# Patient Record
Sex: Female | Born: 1945 | Race: White | Hispanic: No | Marital: Single | Smoking: Never smoker
Health system: Southern US, Community
[De-identification: ages and names within clinical notes are randomized; demographics above are authoritative.]

## PROBLEM LIST (undated history)

## (undated) ENCOUNTER — Ambulatory Visit: Admission: EM

## (undated) DIAGNOSIS — I10 Essential (primary) hypertension: Secondary | ICD-10-CM

## (undated) DIAGNOSIS — M8580 Other specified disorders of bone density and structure, unspecified site: Secondary | ICD-10-CM

## (undated) DIAGNOSIS — H16009 Unspecified corneal ulcer, unspecified eye: Secondary | ICD-10-CM

## (undated) DIAGNOSIS — D219 Benign neoplasm of connective and other soft tissue, unspecified: Secondary | ICD-10-CM

## (undated) DIAGNOSIS — I471 Supraventricular tachycardia, unspecified: Secondary | ICD-10-CM

## (undated) DIAGNOSIS — J45909 Unspecified asthma, uncomplicated: Secondary | ICD-10-CM

## (undated) DIAGNOSIS — G4733 Obstructive sleep apnea (adult) (pediatric): Secondary | ICD-10-CM

## (undated) DIAGNOSIS — N63 Unspecified lump in unspecified breast: Secondary | ICD-10-CM

## (undated) DIAGNOSIS — M810 Age-related osteoporosis without current pathological fracture: Secondary | ICD-10-CM

## (undated) HISTORY — DX: Essential (primary) hypertension: I10

## (undated) HISTORY — DX: Benign neoplasm of connective and other soft tissue, unspecified: D21.9

## (undated) HISTORY — DX: Obstructive sleep apnea (adult) (pediatric): G47.33

## (undated) HISTORY — DX: Other specified disorders of bone density and structure, unspecified site: M85.80

## (undated) HISTORY — DX: Unspecified corneal ulcer, unspecified eye: H16.009

## (undated) HISTORY — DX: Supraventricular tachycardia: I47.1

## (undated) HISTORY — DX: Supraventricular tachycardia, unspecified: I47.10

## (undated) HISTORY — PX: VAGINAL HYSTERECTOMY: SUR661

## (undated) HISTORY — DX: Unspecified asthma, uncomplicated: J45.909

## (undated) HISTORY — DX: Unspecified lump in unspecified breast: N63.0

## (undated) HISTORY — DX: Age-related osteoporosis without current pathological fracture: M81.0

## (undated) HISTORY — PX: ANTERIOR AND POSTERIOR VAGINAL REPAIR: SUR5

---

## 2001-10-29 ENCOUNTER — Other Ambulatory Visit: Admission: RE | Admit: 2001-10-29 | Discharge: 2001-10-29 | Payer: Self-pay | Admitting: Obstetrics and Gynecology

## 2004-01-26 ENCOUNTER — Other Ambulatory Visit: Admission: RE | Admit: 2004-01-26 | Discharge: 2004-01-26 | Payer: Self-pay | Admitting: Obstetrics and Gynecology

## 2005-04-18 ENCOUNTER — Other Ambulatory Visit: Admission: RE | Admit: 2005-04-18 | Discharge: 2005-04-18 | Payer: Self-pay | Admitting: Obstetrics and Gynecology

## 2009-09-17 ENCOUNTER — Encounter (INDEPENDENT_AMBULATORY_CARE_PROVIDER_SITE_OTHER): Payer: Self-pay | Admitting: Obstetrics and Gynecology

## 2009-09-17 ENCOUNTER — Ambulatory Visit (HOSPITAL_COMMUNITY): Admission: RE | Admit: 2009-09-17 | Discharge: 2009-09-18 | Payer: Self-pay | Admitting: Obstetrics and Gynecology

## 2010-06-28 LAB — CBC
HCT: 34 % — ABNORMAL LOW (ref 36.0–46.0)
HCT: 39.3 % (ref 36.0–46.0)
Hemoglobin: 11.7 g/dL — ABNORMAL LOW (ref 12.0–15.0)
Hemoglobin: 13.7 g/dL (ref 12.0–15.0)
MCHC: 34.3 g/dL (ref 30.0–36.0)
MCHC: 34.8 g/dL (ref 30.0–36.0)
MCV: 92 fL (ref 78.0–100.0)
MCV: 93.3 fL (ref 78.0–100.0)
Platelets: 149 10*3/uL — ABNORMAL LOW (ref 150–400)
Platelets: 174 10*3/uL (ref 150–400)
RBC: 3.65 MIL/uL — ABNORMAL LOW (ref 3.87–5.11)
RBC: 4.27 MIL/uL (ref 3.87–5.11)
RDW: 12.8 % (ref 11.5–15.5)
RDW: 12.9 % (ref 11.5–15.5)
WBC: 6.8 10*3/uL (ref 4.0–10.5)
WBC: 9.3 10*3/uL (ref 4.0–10.5)

## 2011-11-15 DIAGNOSIS — N63 Unspecified lump in unspecified breast: Secondary | ICD-10-CM | POA: Insufficient documentation

## 2011-11-15 DIAGNOSIS — M810 Age-related osteoporosis without current pathological fracture: Secondary | ICD-10-CM | POA: Insufficient documentation

## 2011-11-17 ENCOUNTER — Ambulatory Visit: Payer: Self-pay | Admitting: Obstetrics and Gynecology

## 2012-01-19 ENCOUNTER — Ambulatory Visit: Payer: Self-pay | Admitting: Obstetrics and Gynecology

## 2012-01-24 ENCOUNTER — Ambulatory Visit: Payer: Self-pay | Admitting: Obstetrics and Gynecology

## 2012-02-29 ENCOUNTER — Encounter: Payer: Self-pay | Admitting: Obstetrics and Gynecology

## 2012-02-29 ENCOUNTER — Ambulatory Visit (INDEPENDENT_AMBULATORY_CARE_PROVIDER_SITE_OTHER): Payer: Medicare Other | Admitting: Obstetrics and Gynecology

## 2012-02-29 VITALS — BP 130/62 | Ht 63.0 in | Wt 189.0 lb

## 2012-02-29 DIAGNOSIS — Z124 Encounter for screening for malignant neoplasm of cervix: Secondary | ICD-10-CM

## 2012-02-29 DIAGNOSIS — M8580 Other specified disorders of bone density and structure, unspecified site: Secondary | ICD-10-CM | POA: Insufficient documentation

## 2012-02-29 DIAGNOSIS — Z01419 Encounter for gynecological examination (general) (routine) without abnormal findings: Secondary | ICD-10-CM

## 2012-02-29 NOTE — Progress Notes (Signed)
The patient is not taking hormone replacement therapy The patient  is not taking a Calcium supplement. Post-menopausal bleeding:no  Last Pap: approximate date 10/16/2008 and was normal Last mammogram: approximate date 10/2008 and was normal Last DEXA scan : T= -2.16 October 2010 Last colonoscopy:normal 2010  Urinary symptoms: none Normal bowel movements: No: pt states she feels like they are urgent.  Reports abuse at home: No   Subjective:    Holly Harrington is a 66 y.o. female G2P1 who presents for annual exam.  The patient has no complaints today.   The following portions of the patient's history were reviewed and updated as appropriate: allergies, current medications, past family history, past medical history, past social history, past surgical history and problem list.  Review of Systems Pertinent items are noted in HPI. Gastrointestinal:No change in bowel habits, no abdominal pain, no rectal bleeding Genitourinary:negative for dysuria, frequency, hematuria, nocturia and urinary incontinence    Objective:     BP 130/62  Ht 5\' 3"  (1.6 m)  Wt 189 lb (85.73 kg)  BMI 33.48 kg/m2  Weight:  Wt Readings from Last 1 Encounters:  02/29/12 189 lb (85.73 kg)     BMI: Body mass index is 33.48 kg/(m^2). General Appearance: Alert, appropriate appearance for age. No acute distress HEENT: Grossly normal Neck / Thyroid: Supple, no masses, nodes or enlargement Lungs: clear to auscultation bilaterally with minimal wheezing on Right: pt did not use her inhaler today Back: No CVA tenderness Breast Exam: No masses or nodes.No dimpling, nipple retraction or discharge. Cardiovascular: Regular rate and rhythm. S1, S2, no murmur Gastrointestinal: Soft, non-tender, no masses or organomegaly Pelvic Exam: Vulva and vagina appear normal. Bimanual exam reveals normal adnexa.Uterus surgically absent Rectovaginal: normal rectal, no masses Lymphatic Exam: Non-palpable nodes in neck, clavicular,  axillary, or inguinal regions Skin: no rash or abnormalities Neurologic: Normal gait and speech, no tremor  Psychiatric: Alert and oriented, appropriate affect.      Assessment:    Normal gyn exam  Urinary and fecal urgency with normal pelvic exam   Plan:   Recommend Cranberry supplement and Benefiber  mammogram pap smear return annually or prn DEXA next year    Silverio Lay MD

## 2014-02-10 ENCOUNTER — Encounter: Payer: Self-pay | Admitting: Obstetrics and Gynecology

## 2019-09-26 ENCOUNTER — Encounter: Payer: Self-pay | Admitting: Emergency Medicine

## 2019-09-26 ENCOUNTER — Other Ambulatory Visit: Payer: Self-pay

## 2019-09-26 ENCOUNTER — Emergency Department
Admission: EM | Admit: 2019-09-26 | Discharge: 2019-09-26 | Disposition: A | Discharge status: Home / Self Care | Payer: Medicare PPO | Attending: Student | Admitting: Student

## 2019-09-26 ENCOUNTER — Emergency Department: Payer: Medicare PPO

## 2019-09-26 DIAGNOSIS — R079 Chest pain, unspecified: Secondary | ICD-10-CM

## 2019-09-26 DIAGNOSIS — R0789 Other chest pain: Secondary | ICD-10-CM | POA: Diagnosis present

## 2019-09-26 DIAGNOSIS — Z79899 Other long term (current) drug therapy: Secondary | ICD-10-CM | POA: Insufficient documentation

## 2019-09-26 LAB — COMPREHENSIVE METABOLIC PANEL
ALT: 13 U/L (ref 0–44)
AST: 20 U/L (ref 15–41)
Albumin: 4 g/dL (ref 3.5–5.0)
Alkaline Phosphatase: 61 U/L (ref 38–126)
Anion gap: 7 (ref 5–15)
BUN: 20 mg/dL (ref 8–23)
CO2: 28 mmol/L (ref 22–32)
Calcium: 9.2 mg/dL (ref 8.9–10.3)
Chloride: 107 mmol/L (ref 98–111)
Creatinine, Ser: 0.75 mg/dL (ref 0.44–1.00)
GFR calc Af Amer: >60 mL/min (ref >60)
GFR calc non Af Amer: >60 mL/min (ref >60)
Glucose, Bld: 108 mg/dL — ABNORMAL HIGH (ref 70–99)
Potassium: 4.1 mmol/L (ref 3.5–5.1)
Sodium: 142 mmol/L (ref 135–145)
Total Bilirubin: 0.9 mg/dL (ref 0.3–1.2)
Total Protein: 7.4 g/dL (ref 6.5–8.1)

## 2019-09-26 LAB — CBC WITH DIFFERENTIAL/PLATELET
Abs Immature Granulocytes: 0.02 10*3/uL (ref 0.00–0.07)
Basophils Absolute: 0.1 10*3/uL (ref 0.0–0.1)
Basophils Relative: 1 %
Eosinophils Absolute: 0.6 10*3/uL — ABNORMAL HIGH (ref 0.0–0.5)
Eosinophils Relative: 8 %
HCT: 40 % (ref 36.0–46.0)
Hemoglobin: 13.3 g/dL (ref 12.0–15.0)
Immature Granulocytes: 0 %
Lymphocytes Relative: 42 %
Lymphs Abs: 3.2 10*3/uL (ref 0.7–4.0)
MCH: 31 pg (ref 26.0–34.0)
MCHC: 33.3 g/dL (ref 30.0–36.0)
MCV: 93.2 fL (ref 80.0–100.0)
Monocytes Absolute: 0.4 10*3/uL (ref 0.1–1.0)
Monocytes Relative: 5 %
Neutro Abs: 3.3 10*3/uL (ref 1.7–7.7)
Neutrophils Relative %: 44 %
Platelets: 184 10*3/uL (ref 150–400)
RBC: 4.29 MIL/uL (ref 3.87–5.11)
RDW: 12.8 % (ref 11.5–15.5)
WBC: 7.7 10*3/uL (ref 4.0–10.5)
nRBC: 0 % (ref 0.0–0.2)

## 2019-09-26 LAB — TROPONIN I (HIGH SENSITIVITY)
Troponin I (High Sensitivity): 9 ng/L (ref <18)
Troponin I (High Sensitivity): 10 ng/L (ref <18)

## 2019-09-26 IMAGING — CR DG CHEST 2V
1 series · 2 of 2 positions shown · non-contrast
Comparison: None.

CLINICAL DATA: Chest pain

EXAM:
CHEST - 2 VIEW

[Series 1: dg chest 2 view · 0.14mm/px · 2 of 2 slices shown]
[im 1/2]
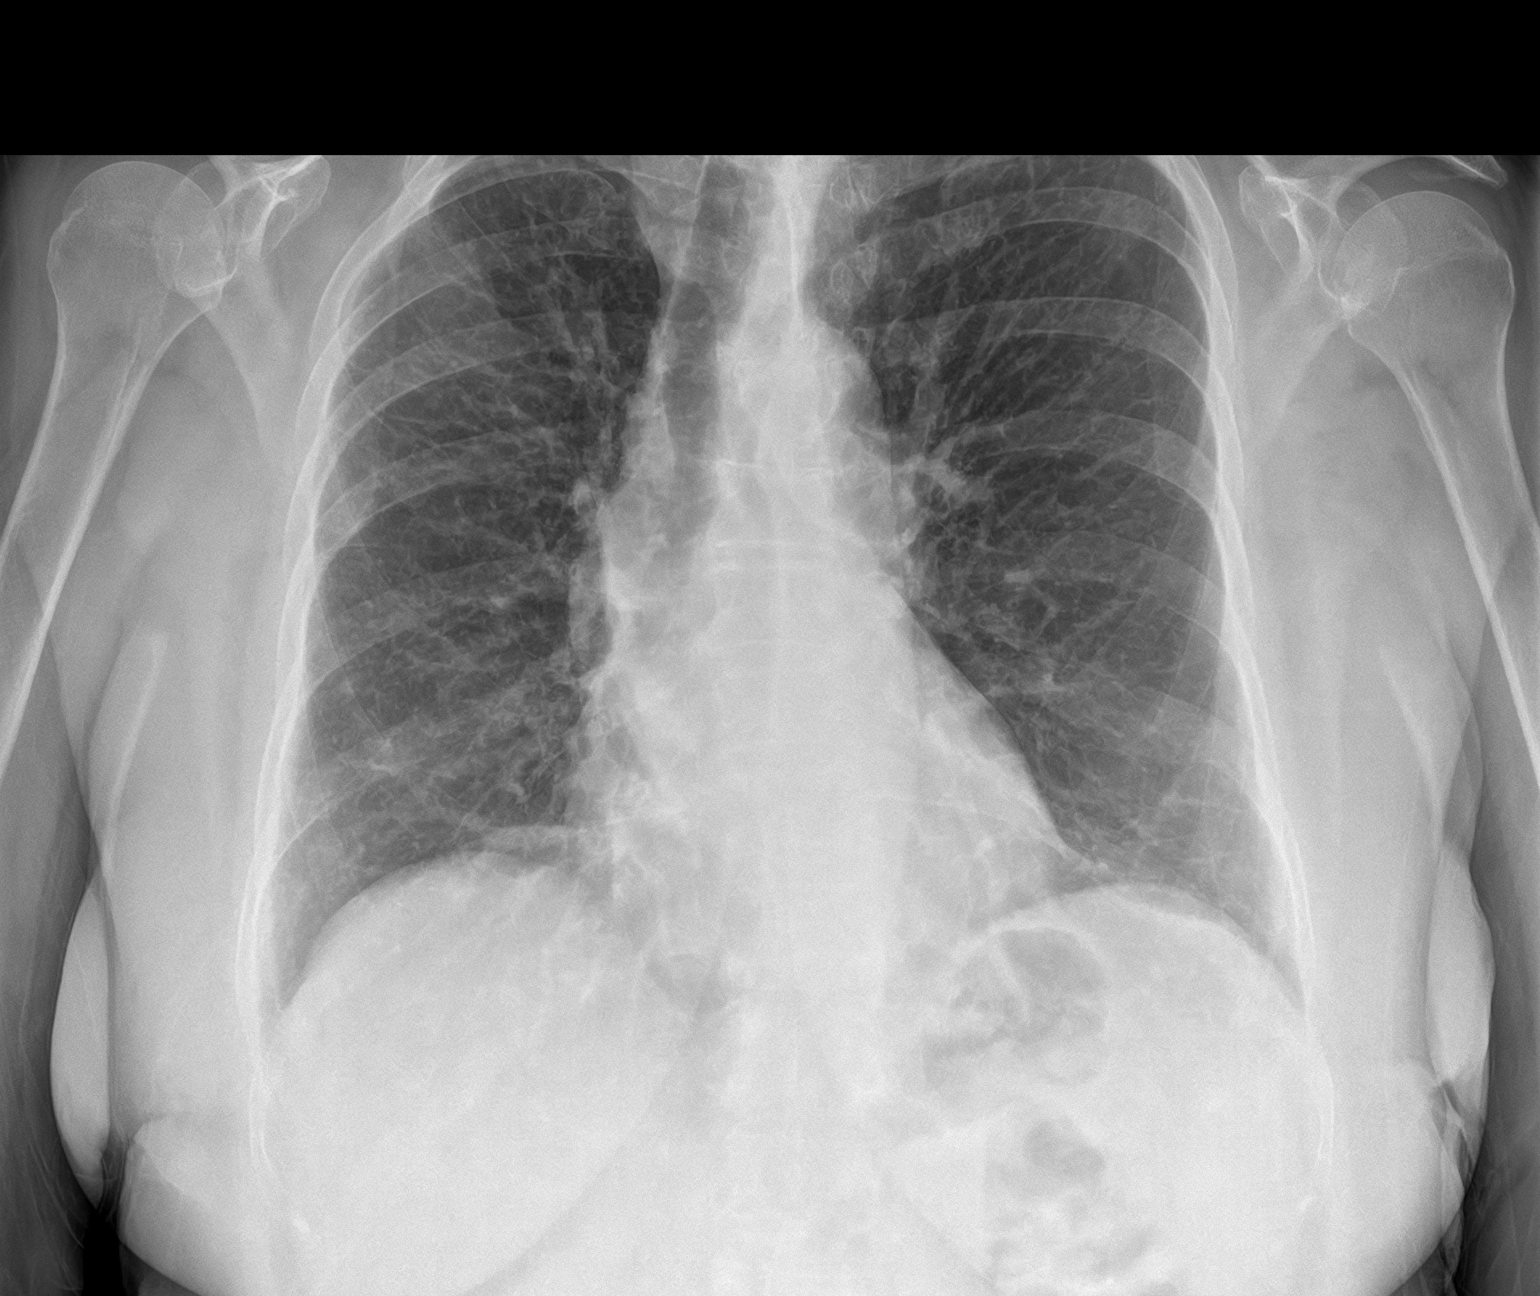
[im 2/2]
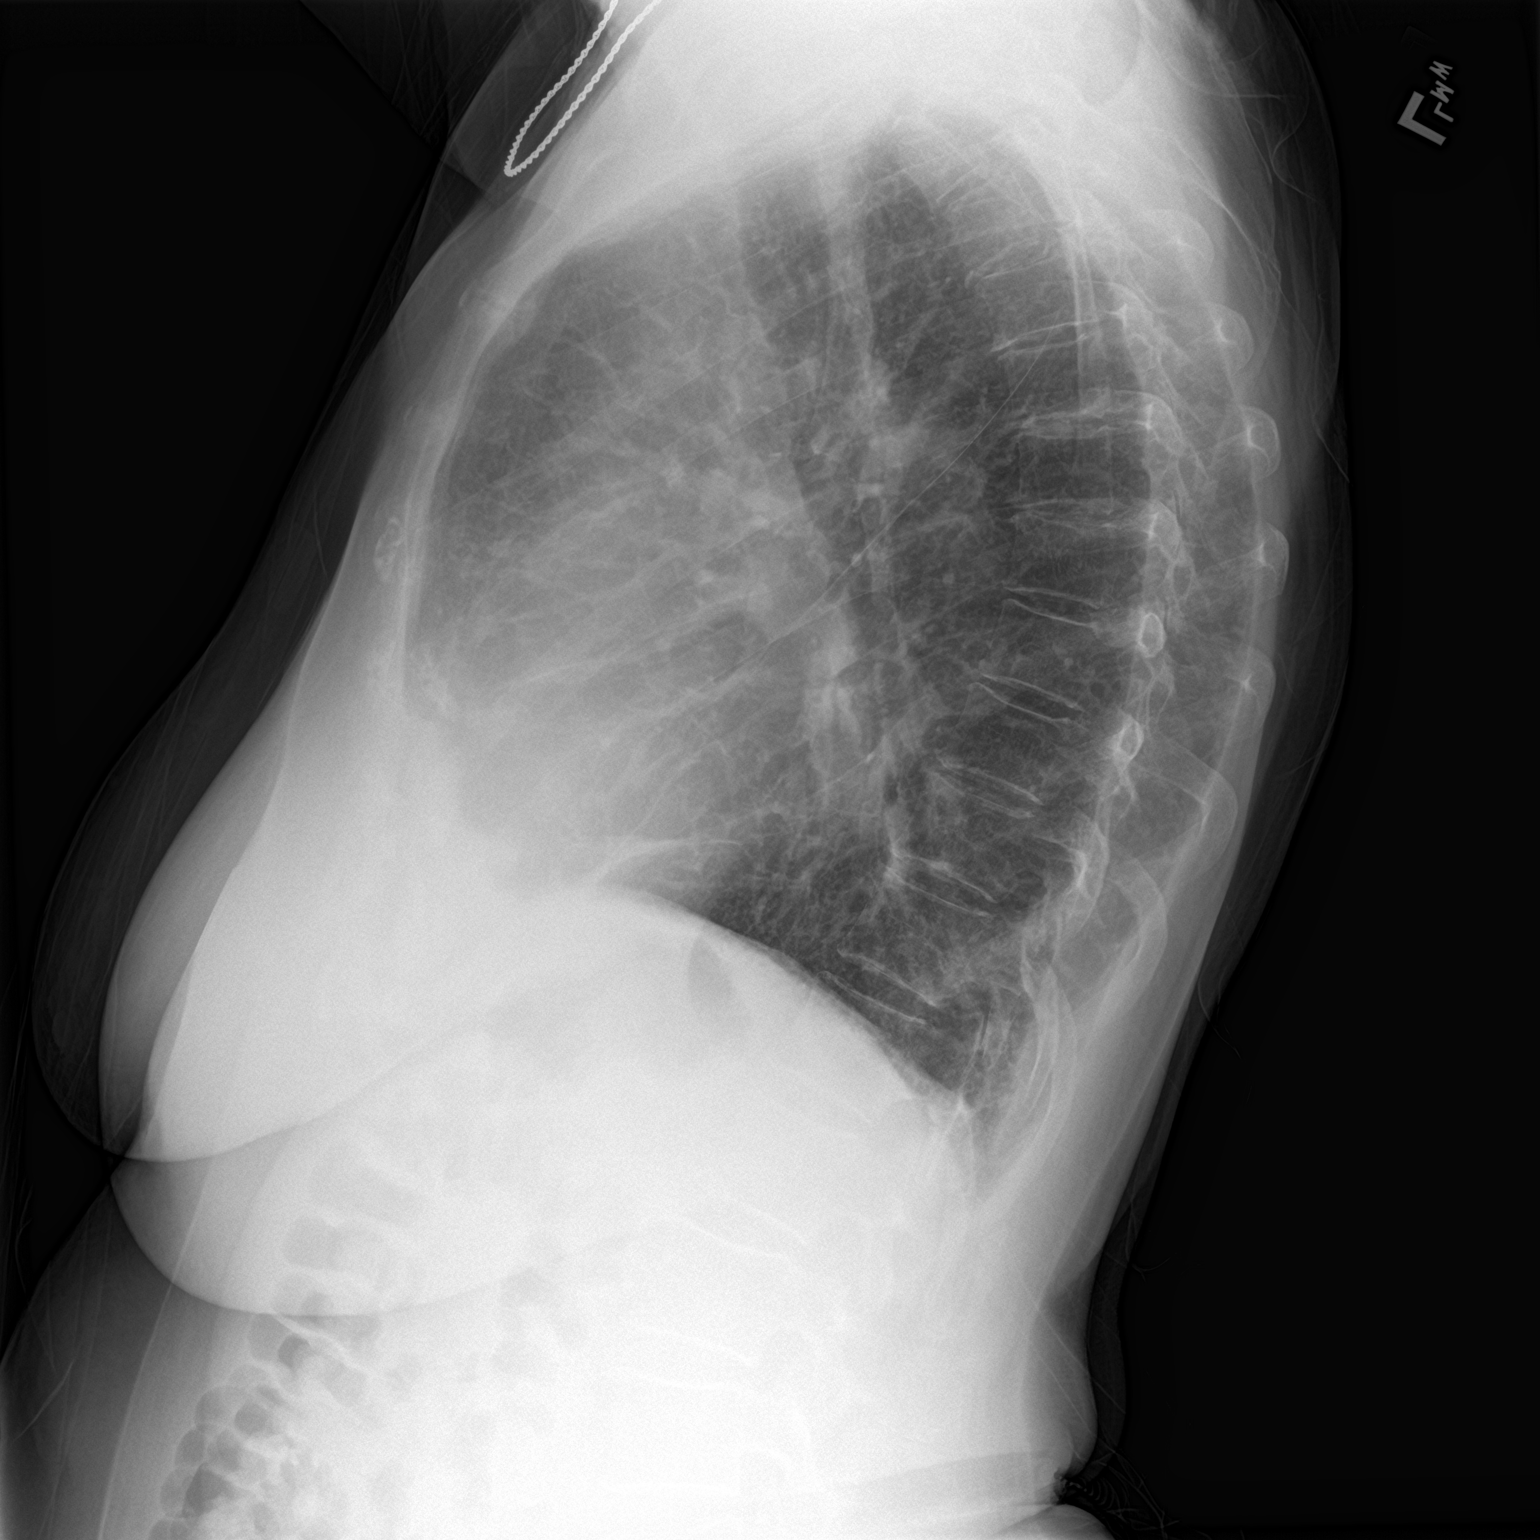

[2 of 2 positions shown; findings below may reference images not displayed]

FINDINGS: The heart size and mediastinal contours are within normal limits.
Mildly increased interstitial markings are seen at both lung bases.
No large airspace consolidation or pleural effusion. No acute
osseous abnormality.
IMPRESSION: Mildly increased interstitial markings of both lung bases which may
be due to atelectasis and/or chronic lung changes.

## 2019-09-26 MED ORDER — NAPROXEN 500 MG PO TABS: 500.0000 mg | ORAL_TABLET | Freq: Two times a day (BID) | ORAL | 0 refills | Status: AC

## 2019-09-26 MED ORDER — ASPIRIN 81 MG PO CHEW
324.0000 mg | CHEWABLE_TABLET | Freq: Once | ORAL | Status: AC
  Administered 2019-09-26: 324 mg via ORAL
  Filled 2019-09-26: qty 4

## 2019-09-26 NOTE — ED Triage Notes (Signed)
Pt to triage via w/c with no distress noted, mask in place; pt reports awoke to go to BR and began having pain beneath left breast, nonradiating with no accomp symptoms

## 2019-09-26 NOTE — Discharge Instructions (Addendum)
Thank you for letting us take care of you in the emergency department today.   Please continue to take any regular, prescribed medications.   New medications we have prescribed:  Naproxen, for pain/inflammation  Please follow up with: Cardiology doctor, information below  Please return to the ER for any new or worsening symptoms.

## 2019-09-26 NOTE — ED Provider Notes (Signed)
[facility] Emergency Department Provider Note  ____________________________________________   First MD Initiated Contact with Patient 09/26/19 [phone]     (approximate)  I have reviewed the triage vital signs and the nursing notes.  History  Chief Complaint Chest Pain    HPI Holly Harrington is a 74 y.o. female w/ hx of asthma, osteoporosis, who presents for an episode of chest pain.  Patient states she woke up in the middle the night to use the restroom.  While walking to the restroom she developed sudden onset of left-sided chest pain.  She locates it just underneath her breast.  Hervey Ard and severe.  No radiation.  She tried laying back in the bed, but states the pain was too severe.  Rested for a little while on the couch but felt like her symptoms were worsening, therefore presented to the ER for further evaluation.  Since being in the waiting room her pain has significantly improved and by my evaluation she states she is asymptomatic.  Pain currently 0/10.  No associated nausea, diaphoresis, shortness of breath.  No fevers or cough.  No changes to her activity level.  No rashes or lesions.  No trauma. No recent illnesses/infection.    Past Medical Hx Past Medical History:  Diagnosis Date  . Asthma   . Breast lump   . Fibroid   . Osteopenia   . Osteoporosis     Problem List Patient Active Problem List   Diagnosis Date Noted  . Osteoporosis   . Breast lump     Past Surgical Hx Past Surgical History:  Procedure Laterality Date  . ANTERIOR AND POSTERIOR VAGINAL REPAIR    . VAGINAL HYSTERECTOMY      Medications Prior to Admission medications   Medication Sig Start Date End Date Taking? Authorizing Provider  ALBUTEROL IN Inhale into the lungs.    [provider]  fish oil-omega-3 fatty acids 1000 MG capsule Take 2 g by mouth daily.    [provider]  fluticasone (FLOVENT HFA) 110 MCG/ACT inhaler Inhale 1 puff into the lungs 2  (two) times daily.    [provider]  Multiple Vitamin (MULTIVITAMIN) tablet Take 1 tablet by mouth daily.    [provider]    Allergies Codeine, Hydrocodone, and Oxycodone  Family Hx Family History  Problem Relation Age of Onset  . Breast cancer Mother 51  . Breast cancer Maternal Aunt 83    Social Hx Social History   Tobacco Use  . Smoking status: Never Smoker  . Smokeless tobacco: Never Used  Substance Use Topics  . Alcohol use: No  . Drug use: No     Review of Systems  Constitutional: Negative for fever. Negative for chills. Eyes: Negative for visual changes. ENT: Negative for sore throat. Cardiovascular: + for chest pain. Respiratory: Negative for shortness of breath. Gastrointestinal: Negative for nausea. Negative for vomiting.  Genitourinary: Negative for dysuria. Musculoskeletal: Negative for leg swelling. Skin: Negative for rash. Neurological: Negative for headaches.   Physical Exam  Vital Signs: ED Triage Vitals  Enc Vitals Group     BP 09/26/19 0426 (!) 190/74     Pulse Rate 09/26/19 0426 73     Resp 09/26/19 0426 17     Temp 09/26/19 0426 97.8 F (36.6 C)     Temp Source 09/26/19 0426 Oral     SpO2 09/26/19 0426 97 %     Weight 09/26/19 0426 175 lb (79.4 kg)  Height 09/26/19 0426 5\' 3"  (1.6 m)     Head Circumference --      Peak Flow --      Pain Score 09/26/19 0425 5     Pain Loc --      Pain Edu? --      Excl. in [place]? --     Constitutional: Alert and oriented. Well appearing. NAD.  Head: Normocephalic. Atraumatic. Eyes: Conjunctivae clear. Sclera anicteric. Pupils equal and symmetric. Nose: No masses or lesions. No congestion or rhinorrhea. Mouth/Throat: Wearing mask.  Neck: No stridor. Trachea midline.  Cardiovascular: Normal rate, regular rhythm. Extremities well perfused. Respiratory: Normal respiratory effort.  Lungs CTAB.  Rare, faint inspiratory squeak on left.  Chest wall NT, no palpable step-offs,  deformities, or crepitance. Gastrointestinal: Soft. Non-distended. Non-tender.  Genitourinary: Deferred. Musculoskeletal: No lower extremity edema. No deformities. Neurologic:  Normal speech and language. No gross focal or lateralizing neurologic deficits are appreciated.  Skin: Skin is warm, dry and intact. No rash noted.  On visual inspection of the chest wall, there are no ecchymosis, lacerations, vesicles, or other lesions. Psychiatric: Mood and affect are appropriate for situation.  EKG  Personally reviewed and interpreted by myself.   Date: 09/26/19 Time: 0428 Rate: 71 Rhythm: sinus Axis: borderline left Intervals: PR 200 ms No acute ischemic changes No acute arrhythmias  No STEMI    Radiology  Personally reviewed available imaging myself.   CXR - IMPRESSION:  Mildly increased interstitial markings of both lung bases which may  be due to atelectasis and/or chronic lung changes.    Procedures  Procedure(s) performed (including critical care):  Procedures   Initial Impression / Assessment and Plan / MDM / ED Course  74 y.o. female who presents to the ED for an episode of sharp, left-sided chest pain, now resolved.  Ddx: ACS, costochondritis/MSK, pleurisy.  No shortness of breath, tachycardia, tachypnea, or hypoxia, therefore doubt PE.  No evidence on exam for shingles.  Will plan for labs, imaging, EKG, ASA  Troponin x 2 negative.  EKG as above, no acute arrhythmias, no STEMI, no acute ischemic changes.  Remainder of blood work without actual derangements.  CXR with atelectasis and/or chronic lung changes.  No consolidation, no fever or cough to suggest infection.  Given negative work-up, feel patient stable for discharge.  Suspect a component of pleurisy based on her history.  Will plan for Rx for naproxen for symptom control.  Advised outpatient follow-up, referral to cardiology, and given return precautions.  Patient voices understanding is in  agreement.   _______________________________   As part of my medical decision making I have reviewed available labs, radiology tests, reviewed old records/performed chart review.    Final Clinical Impression(s) / ED Diagnosis  Final diagnoses:  Chest pain in adult       Note:  This document was prepared using Dragon voice recognition software and may include unintentional dictation errors.   Lilia Pro., MD 09/26/19 [phone]

## 2019-10-22 NOTE — Progress Notes (Signed)
New Outpatient Visit Date: 10/23/2019  Referring Provider: Lilia Pro., MD [place],  [place] [address]  Chief Complaint: Chest pain  HPI:  Holly Harrington is a 74 y.o. female who is being seen today for the evaluation of chest pain at the request of Dr. Joan Mayans. She has a history of asthma and osteoporosis.  She presented to the [facility] emergency department last month (09/26/2019) having developed sudden onset of left-sided chest pain while walking to the bathroom in the middle the night.  ED work-up was unrevealing with negative troponin and normal EKG other than borderline LVH.  Today, Holly Harrington reports that she is feeling well.  On the day of her ED admission, she was returning from the bathroom to her bed when she had sudden onset of severe substernal and left-sided chest pain.  It was sharp and prevented her from climbing back to bed.  She therefore walked to her living room and sat down but needed to stop along the way due to the pain.  Her son found her and ultimately brought her to the [facility] emergency department due to persistent pain.  Pain began to subside as she proceeded to the emergency department and had essentially resolved by the time she was seen.  Work-up was unrevealing and she was given a diagnosis of pleurisy.  Holly Harrington notes that she has experienced intermittent chest pain with deep inspiration over the last several years, though she is unsure if deep inspiration seem to affect her pain this time.  Holly Harrington denies a history of prior cardiac disease.  Holly Harrington notes occasional palpitations that are most pronounced when she lays on her left side.  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  She was on methotrexate at one point, though this was discontinued, possibly due to a lung abnormality noted on CT of the chest at [facility] a few years  ago.  --------------------------------------------------------------------------------------------------  Cardiovascular History & Procedures: Cardiovascular Problems:  Chest pain  Palpitations  Risk Factors:  Age greater than 67 and obesity  Cath/PCI:  None  CV Surgery:  None  EP Procedures and Devices:  None  Non-Invasive Evaluation(s):  None  Recent CV Pertinent Labs: Lab Results  Component Value Date   K 4.1 09/26/2019   BUN 20 09/26/2019   CREATININE 0.75 09/26/2019    --------------------------------------------------------------------------------------------------  Past Medical History:  Diagnosis Date  . Asthma   . Breast lump   . Fibroid   . Osteopenia   . Osteoporosis     Past Surgical History:  Procedure Laterality Date  . ANTERIOR AND POSTERIOR VAGINAL REPAIR    . VAGINAL HYSTERECTOMY      Current Meds  Medication Sig  . ALBUTEROL IN Inhale into the lungs as needed.   . CYANOCOBALAMIN PO Take by mouth daily.  . Multiple Vitamin (MULTIVITAMIN) tablet Take 1 tablet by mouth daily.  Marland Kitchen VITAMIN D, CHOLECALCIFEROL, PO Take by mouth daily.    Allergies: Codeine, Hydrocodone, and Oxycodone  Social History   Tobacco Use  . Smoking status: Never Smoker  . Smokeless tobacco: Never Used  Vaping Use  . Vaping Use: Never used  Substance Use Topics  . Alcohol use: No  . Drug use: No    Family History  Problem Relation Age of Onset  . Breast cancer Mother 63  . Breast cancer Maternal Aunt 36    Review of Systems: A 12-system  review of systems was performed and was negative except as noted in the HPI.  --------------------------------------------------------------------------------------------------  Physical Exam: BP 140/80 (BP Location: Right Arm, Patient Position: Sitting, Cuff Size: Normal)   Pulse 70   Ht 5\' 3"  (1.6 m)   Wt 172 lb 8 oz (78.2 kg)   SpO2 94%   BMI 30.56 kg/m   General: NAD. HEENT: No conjunctival pallor  or scleral icterus. Facemask in place. Neck: Supple without lymphadenopathy, thyromegaly, JVD, or HJR. No carotid bruit. Lungs: Normal work of breathing. Clear to auscultation bilaterally without wheezes or crackles. Heart: Regular rate and rhythm without murmurs, rubs, or gallops. Non-displaced PMI. Abd: Bowel sounds present. Soft, NT/ND without hepatosplenomegaly Ext: No lower extremity edema. Radial, PT, and DP pulses are 2+ bilaterally Skin: Warm and dry without rash. Neuro: CNIII-XII intact. Strength and fine-touch sensation intact in upper and lower extremities bilaterally. Psych: Normal mood and affect.  EKG: Normal sinus rhythm with borderline LVH.  No significant change from prior tracing on 09/26/2019.  Lab Results  Component Value Date   WBC 7.7 09/26/2019   HGB 13.3 09/26/2019   HCT 40.0 09/26/2019   MCV 93.2 09/26/2019   PLT 184 09/26/2019    Lab Results  Component Value Date   NA 142 09/26/2019   K 4.1 09/26/2019   CL 107 09/26/2019   CO2 28 09/26/2019   BUN 20 09/26/2019   CREATININE 0.75 09/26/2019   GLUCOSE 108 (H) 09/26/2019   ALT 13 09/26/2019    No results found for: CHOL, HDL, LDLCALC, LDLDIRECT, TRIG, CHOLHDL   --------------------------------------------------------------------------------------------------  ASSESSMENT AND PLAN: Chest pain: Timing and quality of pain are atypical for coronary insufficiency.  Work-up in the ED was unrevealing with nonischemic EKG and negative high-sensitivity troponin x2.  Examination today is unrevealing.  Cardiac risk factors include age and obesity.  I have recommended that we obtain an exercise tolerance test to exclude underlying obstructive CAD.  In the meantime, I have asked Holly Harrington to begin taking aspirin 81 mg daily.  Palpitations: Intermittent and most pronounced when lying in the left lateral decubitus position.  EKG today is without arrhythmia.  We have discussed further evaluation and have agreed to  obtain a 14-day event monitor, to be placed immediately after aforementioned exercise tolerance test.  Elevated blood pressure: Blood pressure borderline elevated today.  We will defer medication changes at this time, though if it remains at or above 140/90, pharmacotherapy will need to be considered down the road.  Follow-up: Return to clinic in 2 months (after completion of exercise tolerance test and event monitor).  [name], MD 10/23/2019 10:55 AM

## 2019-10-23 ENCOUNTER — Ambulatory Visit: Payer: Medicare PPO | Admitting: Internal Medicine

## 2019-10-23 ENCOUNTER — Other Ambulatory Visit: Payer: Self-pay

## 2019-10-23 ENCOUNTER — Encounter: Payer: Self-pay | Admitting: Internal Medicine

## 2019-10-23 VITALS — BP 140/80 | HR 70 | Ht 63.0 in | Wt 172.5 lb

## 2019-10-23 DIAGNOSIS — R002 Palpitations: Secondary | ICD-10-CM | POA: Diagnosis not present

## 2019-10-23 DIAGNOSIS — R030 Elevated blood-pressure reading, without diagnosis of hypertension: Secondary | ICD-10-CM

## 2019-10-23 DIAGNOSIS — R079 Chest pain, unspecified: Secondary | ICD-10-CM | POA: Diagnosis not present

## 2019-10-23 MED ORDER — ASPIRIN EC 81 MG PO TBEC: 81.0000 mg | DELAYED_RELEASE_TABLET | Freq: Every day | ORAL | 3 refills | Status: DC

## 2019-10-23 NOTE — Patient Instructions (Addendum)
Medication Instructions:  Your physician has recommended you make the following change in your medication:  1- START Aspirin 81 mg by mouth once a day.  *If you need a refill on your cardiac medications before your next appointment, please call your pharmacy*  Lab Work: none If you have labs (blood work) drawn today and your tests are completely normal, you will receive your results only by: Marland Kitchen MyChart Message (if you have MyChart) OR . A paper copy in the mail If you have any lab test that is abnormal or we need to change your treatment, we will call you to review the results.   Testing/Procedures:  1- Exercise Stress Test - Your physician has requested that you have an exercise tolerance test.    DO NOT drink or eat foods with caffeine for 24 hours before the test. (Chocolate, coffee, tea, decaf coffee/tea, or energy drinks)  DO NOT smoke for 4 hours before your test.  If you use an inhaler, bring it with you to the test.  Wear comfortable shoes and clothing. Women do not wear dresses.   2- ZIO MONITOR for 14 DAYS - To be placed on the day of the treadmill test.  Your physician has recommended that you wear a Zio monitor. This monitor is a medical device that records the heart's electrical activity. Doctors most often use these monitors to diagnose arrhythmias. Arrhythmias are problems with the speed or rhythm of the heartbeat. The monitor is a small device applied to your chest. You can wear one while you do your normal daily activities. While wearing this monitor if you have any symptoms to push the button and record what you felt. Once you have worn this monitor for the period of time provider prescribed (Usually 14 days), you will return the monitor device in the postage paid box. Once it is returned they will download the data collected and provide [place] with a report which the provider will then review and we will call you with those results. Important tips:  1. Avoid showering  during the first 24 hours of wearing the monitor. 2. Avoid excessive sweating to help maximize wear time. 3. Do not submerge the device, no hot tubs, and no swimming pools. 4. Keep any lotions or oils away from the patch. 5. After 24 hours you may shower with the patch on. Take brief showers with your back facing the shower head.  6. Do not remove patch once it has been placed because that will interrupt data and decrease adhesive wear time. 7. Push the button when you have any symptoms and write down what you were feeling. 8. Once you have completed wearing your monitor, remove and place into box which has postage paid and place in your outgoing mailbox.  9. If for some reason you have misplaced your box then call our office and we can provide another box and/or mail it off for you.   Follow-Up: At [facility], you and your health needs are our priority.  As part of our continuing mission to provide you with exceptional heart care, we have created designated Provider Care Teams.  These Care Teams include your primary Cardiologist (physician) and Advanced Practice Providers (APPs -  Physician Assistants and Nurse Practitioners) who all work together to provide you with the care you need, when you need it.  We recommend signing up for the patient portal called "MyChart".  Sign up information is provided on this After Visit Summary.  MyChart is used to  connect with patients for Virtual Visits (Telemedicine).  Patients are able to view lab/test results, encounter notes, upcoming appointments, etc.  Non-urgent messages can be sent to your provider as well.   To learn more about what you can do with MyChart, go to [email or web address].    Your next appointment:   2 month(s)  The format for your next appointment:   In Person  Provider:    You may see DR Harrell Gave END or one of the following Advanced Practice Providers on your designated Care Team:    Murray Hodgkins, NP  Christell Faith,  PA-C  Marrianne Mood, PA-C    Exercise Stress Test An exercise stress test is a test to check how your heart works during exercise. You will need to walk on a treadmill or ride an exercise bike for this test. An electrocardiogram (ECG) will record your heartbeat when you are at rest and when you are exercising. You may have an ultrasound or nuclear test after the exercise test. The test is done to check for coronary artery disease (CAD). It is also done to:  See how well you can exercise.  Watch for high blood pressure during exercise.  Test how well you can exercise after treatment.  Check the blood flow to your arms and legs. If your test result is not normal, more testing may be needed. What happens before the procedure?  Follow instructions from your doctor about what you cannot eat or drink. ? Do not have any drinks or foods that have caffeine in them for 24 hours before the test, or as told by your doctor. This includes coffee, tea (even decaf tea), sodas, chocolate, and cocoa.  Ask your doctor about changing or stopping your normal medicines. This is important if you: ? Take diabetes medicines. ? Take beta-blocker medicines. ? Wear a nitroglycerin patch.  If you use an inhaler, bring it with you to the test.  Do not put lotions, powders, creams, or oils on your chest before the test.  Wear comfortable shoes and clothing.  Do not use any products that have nicotine or tobacco in them, such as cigarettes and e-cigarettes. Stop using them at least 4 hours before the test. If you need help quitting, ask your doctor. What happens during the procedure?   Patches (electrodes) will be put on your chest.  Wires will be connected to the patches. The wires will send signals to a machine to record your heartbeat.  Your heart rate will be watched while you are resting and while you are exercising. Your blood pressure will also be watched during the test.  You will walk on a  treadmill or use a stationary bike. If you cannot use these, you may be asked to turn a crank with your hands.  The activity will get harder and will raise your heart rate.  You may be asked to breathe into a tube a few times during the test. This measures the gases that you breathe out.  You will be asked how you are feeling throughout the test.  You will exercise until your heart reaches a target heart rate. You will stop early if: ? You feel dizzy. ? You have chest pain. ? You are out of breath. ? Your blood pressure is too high or too low. ? You have an irregular heartbeat. ? You have pain or aching in your arms or legs. The procedure may vary among doctors and hospitals. What happens after the procedure?  Your blood  pressure, heart rate, breathing rate, and blood oxygen level will be watched after the test.  You may return to your normal diet and activities as told by your doctor.  It is up to you to get the results of your test. Ask your doctor, or the department that is doing the test, when your results will be ready. Summary  An exercise stress test is a test to check how your heart works during exercise.  This test is done to check for coronary artery disease.  Your heart rate will be watched while you are resting and while you are exercising.  Follow instructions from your doctor about what you cannot eat or drink before the test. This information is not intended to replace advice given to you by your health care provider. Make sure you discuss any questions you have with your health care provider. Document Revised: 07/10/2018 Document Reviewed: 06/28/2016 Elsevier Patient Education  [place].

## 2019-11-06 ENCOUNTER — Other Ambulatory Visit: Payer: Self-pay

## 2019-11-06 ENCOUNTER — Ambulatory Visit: Payer: Medicare PPO

## 2019-11-22 ENCOUNTER — Ambulatory Visit (INDEPENDENT_AMBULATORY_CARE_PROVIDER_SITE_OTHER): Payer: Medicare PPO

## 2019-11-22 ENCOUNTER — Other Ambulatory Visit: Payer: Self-pay

## 2019-11-22 ENCOUNTER — Other Ambulatory Visit: Payer: Self-pay | Admitting: *Deleted

## 2019-11-22 ENCOUNTER — Ambulatory Visit: Payer: Medicare PPO

## 2019-11-22 DIAGNOSIS — R079 Chest pain, unspecified: Secondary | ICD-10-CM

## 2019-11-22 DIAGNOSIS — R002 Palpitations: Secondary | ICD-10-CM

## 2019-11-26 LAB — EXERCISE TOLERANCE TEST
Estimated workload: 5.1 METS
Exercise duration (min): 3 min
Exercise duration (sec): 28 s
MPHR: 144 {beats}/min
Peak HR: 125 {beats}/min
Percent HR: 86 %
RPE: 19
Rest HR: 88 {beats}/min

## 2019-12-24 ENCOUNTER — Other Ambulatory Visit: Payer: Self-pay

## 2019-12-24 ENCOUNTER — Ambulatory Visit: Payer: Medicare PPO | Admitting: Physician Assistant

## 2019-12-24 ENCOUNTER — Encounter: Payer: Self-pay | Admitting: Physician Assistant

## 2019-12-24 VITALS — BP 160/90 | HR 82 | Ht 63.75 in | Wt 170.0 lb

## 2019-12-24 DIAGNOSIS — R030 Elevated blood-pressure reading, without diagnosis of hypertension: Secondary | ICD-10-CM | POA: Diagnosis not present

## 2019-12-24 DIAGNOSIS — I493 Ventricular premature depolarization: Secondary | ICD-10-CM

## 2019-12-24 DIAGNOSIS — I491 Atrial premature depolarization: Secondary | ICD-10-CM

## 2019-12-24 DIAGNOSIS — R079 Chest pain, unspecified: Secondary | ICD-10-CM

## 2019-12-24 DIAGNOSIS — R002 Palpitations: Secondary | ICD-10-CM | POA: Diagnosis not present

## 2019-12-24 DIAGNOSIS — I471 Supraventricular tachycardia: Secondary | ICD-10-CM

## 2019-12-24 MED ORDER — AMLODIPINE BESYLATE 2.5 MG PO TABS: 2.5000 mg | ORAL_TABLET | Freq: Every day | ORAL | 3 refills | Status: DC

## 2019-12-24 MED ORDER — CARVEDILOL 3.125 MG PO TABS: 3.1250 mg | ORAL_TABLET | Freq: Two times a day (BID) | ORAL | 3 refills | Status: DC

## 2019-12-24 NOTE — Patient Instructions (Addendum)
Medication Instructions:  1- START Amlodipine Take 1 tablet (2.5 mg total) by mouth daily *If you need a refill on your cardiac medications before your next appointment, please call your pharmacy*   Lab Work: none ordered If you have labs (blood work) drawn today and your tests are completely normal, you will receive your results only by: Holly Harrington MyChart Message (if you have MyChart) OR . A paper copy in the mail If you have any lab test that is abnormal or we need to change your treatment, we will call you to review the results.   Testing/Procedures:    Follow-Up: At [facility], you and your health needs are our priority.  As part of our continuing mission to provide you with exceptional heart care, we have created designated Provider Care Teams.  These Care Teams include your primary Cardiologist (physician) and Advanced Practice Providers (APPs -  Physician Assistants and Nurse Practitioners) who all work together to provide you with the care you need, when you need it.  We recommend signing up for the patient portal called "MyChart".  Sign up information is provided on this After Visit Summary.  MyChart is used to connect with patients for Virtual Visits (Telemedicine).  Patients are able to view lab/test results, encounter notes, upcoming appointments, etc.  Non-urgent messages can be sent to your provider as well.   To learn more about what you can do with MyChart, go to [email or web address].    Your next appointment:   2-3 week(s)  The format for your next appointment:   In Person  Provider:    You may see Dr. Saunders Revel or one of the following Advanced Practice Providers on your designated Care Team:    Murray Hodgkins, NP  Christell Faith, PA-C  Marrianne Mood, PA-C    Other Instructions  Please purchase an upper arm blood pressure cuff, as these are more accurate than the wrist blood pressure cuff. Take your blood pressure at the same time every day and at least once or  twice a week.  As agreed, please start your amlodipine 2.5 mg daily for your blood pressure (one pill per day) once you have finished your bladder medication.  As discussed, think about obtaining an echocardiogram of your heart.  Your monitor showed a faster heart rhythm from the top part of your heart.   You also had extra beats from the top and bottom part of your heart. You should receive a phone call to confirm the results of your monitoring once read by Dr. Saunders Revel.   Please refer to the information provided below.   Supraventricular Tachycardia, Adult Supraventricular tachycardia (SVT) is a kind of abnormal heartbeat. It makes your heart beat very fast and then beat at a normal speed. A normal resting heartbeat is 60-100 times a minute. This condition can make your heart beat more than 150 times a minute. Times of having a fast heartbeat (episodes) can be scary, but they are usually not dangerous. In some cases, they may lead to heart failure if:  They happen many times per day.  Last longer than a few seconds. What are the causes?   A normal heartbeat starts when an area called the sinoatrial node sends out an electrical signal. In SVT, other areas of the heart send out signals that get in the way of the signal from the sinoatrial node. What increases the risk? You are more likely to develop this condition if you are:  76-49 years old.  A woman. The  following factors may make you more likely to develop this condition:  Stress.  Tiredness.  Smoking.  Stimulant drugs, such as cocaine and methamphetamine.  Alcohol.  Caffeine.  Pregnancy.  Feeling worried or nervous (anxiety). What are the signs or symptoms?  A pounding heart.  A feeling that your heart is skipping beats (palpitations).  Weakness.  Trouble getting enough air.  Pain or tightness in your chest.  Feeling like you are going to pass out (faint).  Feeling worried or  nervous.  Dizziness.  Sweating.  Feeling sick to your stomach (nausea).  Passing out.  Tiredness. Sometimes, there are no symptoms. How is this treated?  Vagal nerve stimulation. Ways to do this include: ? Holding your breath and pushing, as though you are pooping (having a bowel movement). ? Massaging an area on one side of your neck. Do not try this yourself. Only a doctor should do this. If done the wrong way, it can lead to a stroke. ? Bending forward with your head between your legs. ? Coughing while bending forward with your head between your legs. ? Closing your eyes and massaging your eyeballs. Ask a doctor how to do this.  Medicines that prevent attacks.  Medicine to stop an attack given through an IV tube at the hospital.  A small electric shock (cardioversion) that stops an attack.  Radiofrequency ablation. In this procedure, a small, thin tube (catheter) is used to send energy to the area that is causing the rapid heartbeats. If you do not have symptoms, you may not need treatment. Follow these instructions at home: Stress  Avoid things that make you feel stressed.  To deal with stress, try: ? Doing yoga or meditation, or being out in nature. ? Listening to relaxing music. ? Doing deep breathing. ? Taking steps to be healthy, such as getting lots of sleep, exercising, and eating a balanced diet. ? Talking with a mental health doctor. Lifestyle    Try to get at least 7 hours of sleep each night.  Do not use any products that contain nicotine or tobacco, such as cigarettes, e-cigarettes, and chewing tobacco. If you need help quitting, ask your doctor.  Be aware of how alcohol affects you. ? If alcohol gives you a fast heartbeat, do not drink alcohol. ? If alcohol does not seem to give you a fast heartbeat, limit alcohol use to no more than 1 drink a day for women who are not pregnant, and 2 drinks a day for men. In the [place] one drink is one of  these:  12 oz of beer (355 mL).  5 oz of wine (148 mL).  1 oz of hard liquor (44 mL).  Be aware of how caffeine affects you. ? If caffeine gives you a fast heartbeat, do not eat, drink, or use anything with caffeine in it. ? If caffeine does not seem to give you a fast heartbeat, limit how much caffeine you eat, drink, or use.  Do not use stimulant drugs. If you need help quitting, ask your doctor. General instructions  Stay at a healthy weight.  Exercise regularly. Ask your doctor about good activities for you. Try one or a mixture of these: ? 150 minutes a week of gentle exercise, like walking or yoga. ? 75 minutes a week of exercise that is very active, like running or swimming.  Do vagus nerve treatments to slow down your heartbeat as told by your doctor.  Take over-the-counter and prescription medicines only as told  by your doctor.  Keep all follow-up visits as told by your doctor. This is important. Contact a doctor if:  You have a fast heartbeat more often.  Times of having a fast heartbeat last longer than before.  Home treatments to slow down your heartbeat do not help.  You have new symptoms. Get help right away if:  You have chest pain.  Your symptoms get worse.  You have trouble breathing.  Your heart beats very fast for more than 20 minutes.  You pass out. These symptoms may be an emergency. Do not wait to see if the symptoms will go away. Get medical help right away. Call your local emergency services (911 in the [place] Do not drive yourself to the hospital. Summary  SVT is a type of abnormal heart beat.  This condition can make your heart beat more than 150 times a minute.  Treatment depends on how often the condition happens and your symptoms. This information is not intended to replace advice given to you by your health care provider. Make sure you discuss any questions you have with your health care provider. Document Revised: 02/13/2018  Document Reviewed: 02/13/2018 Elsevier Patient Education  2020 [employer].
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
## 2020-01-10 DIAGNOSIS — I38 Endocarditis, valve unspecified: Secondary | ICD-10-CM

## 2020-01-10 HISTORY — DX: Endocarditis, valve unspecified: I38

## 2020-01-16 ENCOUNTER — Encounter: Payer: Self-pay | Admitting: Physician Assistant

## 2020-01-16 ENCOUNTER — Other Ambulatory Visit: Payer: Self-pay

## 2020-01-16 ENCOUNTER — Ambulatory Visit (INDEPENDENT_AMBULATORY_CARE_PROVIDER_SITE_OTHER): Payer: Medicare PPO | Admitting: Physician Assistant

## 2020-01-16 VITALS — BP 136/60 | HR 86 | Ht 63.0 in | Wt 168.0 lb

## 2020-01-16 DIAGNOSIS — I491 Atrial premature depolarization: Secondary | ICD-10-CM | POA: Diagnosis not present

## 2020-01-16 DIAGNOSIS — Z87898 Personal history of other specified conditions: Secondary | ICD-10-CM

## 2020-01-16 DIAGNOSIS — I471 Supraventricular tachycardia: Secondary | ICD-10-CM

## 2020-01-16 DIAGNOSIS — I10 Essential (primary) hypertension: Secondary | ICD-10-CM

## 2020-01-16 DIAGNOSIS — I493 Ventricular premature depolarization: Secondary | ICD-10-CM
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
## 2020-01-16 NOTE — Patient Instructions (Signed)
Medication Instructions:  Your physician recommends that you continue on your current medications as directed. Please refer to the Current Medication list given to you today.  Continue to monitor your blood pressure at home and call our office is consistently greater than 130/80.   *If you need a refill on your cardiac medications before your next appointment, please call your pharmacy*   Lab Work: none If you have labs (blood work) drawn today and your tests are completely normal, you will receive your results only by: Marland Kitchen MyChart Message (if you have MyChart) OR . A paper copy in the mail If you have any lab test that is abnormal or we need to change your treatment, we will call you to review the results.   Testing/Procedures: Your physician has requested that you have an echocardiogram. Echocardiography is a painless test that uses sound waves to create images of your heart. It provides your doctor with information about the size and shape of your heart and how well your heart's chambers and valves are working. This procedure takes approximately one hour. There are no restrictions for this procedure. You may get an IV, if needed, to receive an ultrasound enhancing agent through to better visualize your heart.   Follow-Up: At [facility], you and your health needs are our priority.  As part of our continuing mission to provide you with exceptional heart care, we have created designated Provider Care Teams.  These Care Teams include your primary Cardiologist (physician) and Advanced Practice Providers (APPs -  Physician Assistants and Nurse Practitioners) who all work together to provide you with the care you need, when you need it.  We recommend signing up for the patient portal called "MyChart".  Sign up information is provided on this After Visit Summary.  MyChart is used to connect with patients for Virtual Visits (Telemedicine).  Patients are able to view lab/test results, encounter  notes, upcoming appointments, etc.  Non-urgent messages can be sent to your provider as well.   To learn more about what you can do with MyChart, go to [email or web address].    Your next appointment:   6 month(s)  The format for your next appointment:   In Person  Provider:   You may see [name], MD or one of the following Advanced Practice Providers on your designated Care Team:    Murray Hodgkins, NP  Christell Faith, PA-C  Marrianne Mood, PA-C  Cadence Forman, [place]

## 2020-01-17 ENCOUNTER — Telehealth: Payer: Self-pay | Admitting: Physician Assistant

## 2020-01-17 NOTE — Telephone Encounter (Signed)
Patient in to report to [facility] the medication she picked up but was unaware about. Patient picked up Carvedilol 3.125 mg tablet bid. Patient has never been prescribed this and wanted make Holly Harrington aware as she asked for her to call back in

## 2020-01-17 NOTE — Telephone Encounter (Signed)
I spoke with patient and informed her that I saw an order for this medication placed on 12/24/19 and then immediately cancelled, that she is not to take it. Patient had picked up and paid for the medication. She stated that she will try and return it to the pharmacy.

## 2020-02-05 ENCOUNTER — Other Ambulatory Visit: Payer: Self-pay

## 2020-02-05 ENCOUNTER — Ambulatory Visit (INDEPENDENT_AMBULATORY_CARE_PROVIDER_SITE_OTHER): Payer: Medicare PPO

## 2020-02-05 DIAGNOSIS — Z87898 Personal history of other specified conditions: Secondary | ICD-10-CM

## 2020-02-05 DIAGNOSIS — R0789 Other chest pain: Secondary | ICD-10-CM | POA: Diagnosis not present

## 2020-02-05 LAB — ECHOCARDIOGRAM COMPLETE
AR max vel: 2.08 cm2
AV Area VTI: 2.19 cm2
AV Area mean vel: 2.45 cm2
AV Mean grad: 3 mmHg
AV Peak grad: 6.7 mmHg
Ao pk vel: 1.29 m/s
Area-P 1/2: 4.49 cm2
Calc EF: 55.8 %
P 1/2 time: 520 msec
S' Lateral: 2.4 cm
Single Plane A2C EF: 56.4 %
Single Plane A4C EF: 53 %

## 2020-02-18 ENCOUNTER — Other Ambulatory Visit: Payer: Self-pay

## 2020-02-18 ENCOUNTER — Ambulatory Visit: Payer: Medicare PPO | Admitting: Physician Assistant

## 2020-02-18 ENCOUNTER — Encounter: Payer: Self-pay | Admitting: Physician Assistant

## 2020-02-18 VITALS — BP 140/68 | HR 80 | Ht 63.0 in | Wt 164.0 lb

## 2020-02-18 DIAGNOSIS — R0789 Other chest pain: Secondary | ICD-10-CM

## 2020-02-18 DIAGNOSIS — I10 Essential (primary) hypertension: Secondary | ICD-10-CM | POA: Diagnosis not present

## 2020-02-18 DIAGNOSIS — I080 Rheumatic disorders of both mitral and aortic valves: Secondary | ICD-10-CM

## 2020-02-18 DIAGNOSIS — I493 Ventricular premature depolarization: Secondary | ICD-10-CM

## 2020-02-18 DIAGNOSIS — I491 Atrial premature depolarization: Secondary | ICD-10-CM

## 2020-02-18 DIAGNOSIS — R002 Palpitations: Secondary | ICD-10-CM

## 2020-02-18 DIAGNOSIS — I471 Supraventricular tachycardia: Secondary | ICD-10-CM | POA: Diagnosis not present

## 2020-02-18 MED ORDER — DILTIAZEM HCL ER COATED BEADS 120 MG PO CP24: 120.0000 mg | ORAL_CAPSULE | Freq: Every day | ORAL | 1 refills | Status: DC

## 2020-02-18 NOTE — Patient Instructions (Addendum)
Medication Instructions:   Your physician has recommended you make the following change in your medication:   STOP Amlodipine START Diltiazem 120mg  Daily   *If you need a refill on your cardiac medications before your next appointment, please call your pharmacy*   Lab Work: None ordered.  If you have labs (blood work) drawn today and your tests are completely normal, you will receive your results only by: Marland Kitchen MyChart Message (if you have MyChart) OR . A paper copy in the mail If you have any lab test that is abnormal or we need to change your treatment, we will call you to review the results.   Testing/Procedures: None ordered.   Follow-Up: At [facility], you and your health needs are our priority.  As part of our continuing mission to provide you with exceptional heart care, we have created designated Provider Care Teams.  These Care Teams include your primary Cardiologist (physician) and Advanced Practice Providers (APPs -  Physician Assistants and Nurse Practitioners) who all work together to provide you with the care you need, when you need it.  We recommend signing up for the patient portal called "MyChart".  Sign up information is provided on this After Visit Summary.  MyChart is used to connect with patients for Virtual Visits (Telemedicine).  Patients are able to view lab/test results, encounter notes, upcoming appointments, etc.  Non-urgent messages can be sent to your provider as well.   To learn more about what you can do with MyChart, go to [email or web address].    Your next appointment:   1 month(s)  The format for your next appointment:   In Person  Provider:   You may see [name], MD or one of the following Advanced Practice Providers on your designated Care Team:    Murray Hodgkins, NP  Christell Faith, PA-C  Marrianne Mood, PA-C  Cadence Kathlen Mody, [place]    Other Instructions  Your provider recommends that you monitor your BP at home. Notify  our office if your BP is consistently above 130/80.  Please keep total salt intake under 2 g/day and total fluid intake under 2 L/day.
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
## 2020-03-19 ENCOUNTER — Ambulatory Visit: Payer: Medicare PPO | Admitting: Physician Assistant

## 2020-03-19 ENCOUNTER — Other Ambulatory Visit: Payer: Self-pay

## 2020-03-19 ENCOUNTER — Encounter: Payer: Self-pay | Admitting: Physician Assistant

## 2020-03-19 VITALS — BP 122/60 | HR 67 | Ht 63.0 in | Wt 166.0 lb

## 2020-03-19 DIAGNOSIS — R413 Other amnesia: Secondary | ICD-10-CM

## 2020-03-19 DIAGNOSIS — I10 Essential (primary) hypertension: Secondary | ICD-10-CM

## 2020-03-19 DIAGNOSIS — I471 Supraventricular tachycardia: Secondary | ICD-10-CM | POA: Diagnosis not present

## 2020-03-19 DIAGNOSIS — R002 Palpitations: Secondary | ICD-10-CM

## 2020-03-19 DIAGNOSIS — I491 Atrial premature depolarization: Secondary | ICD-10-CM

## 2020-03-19 DIAGNOSIS — R0789 Other chest pain: Secondary | ICD-10-CM

## 2020-03-19 DIAGNOSIS — I080 Rheumatic disorders of both mitral and aortic valves: Secondary | ICD-10-CM

## 2020-03-19 NOTE — Patient Instructions (Signed)
Medication Instructions:  Your physician recommends that you continue on your current medications as directed. Please refer to the Current Medication list given to you today.  *If you need a refill on your cardiac medications before your next appointment, please call your pharmacy*   Lab Work:  Your physician recommends that you have lab work TODAY:  Cmet, CBC, TSH, Free T4, Magnesium  If you have labs (blood work) drawn today and your tests are completely normal, you will receive your results only by: Marland Kitchen MyChart Message (if you have MyChart) OR . A paper copy in the mail If you have any lab test that is abnormal or we need to change your treatment, we will call you to review the results.   Testing/Procedures: None ordered   Follow-Up: At [facility], you and your health needs are our priority.  As part of our continuing mission to provide you with exceptional heart care, we have created designated Provider Care Teams.  These Care Teams include your primary Cardiologist (physician) and Advanced Practice Providers (APPs -  Physician Assistants and Nurse Practitioners) who all work together to provide you with the care you need, when you need it.  We recommend signing up for the patient portal called "MyChart".  Sign up information is provided on this After Visit Summary.  MyChart is used to connect with patients for Virtual Visits (Telemedicine).  Patients are able to view lab/test results, encounter notes, upcoming appointments, etc.  Non-urgent messages can be sent to your provider as well.   To learn more about what you can do with MyChart, go to [email or web address].    Your next appointment:   3 month(s)  The format for your next appointment:   In Person  Provider:   You may see [name], MD or one of the following Advanced Practice Providers on your designated Care Team:    Murray Hodgkins, NP  Christell Faith, PA-C  Marrianne Mood, PA-C  Cadence Kathlen Mody,  [place]  Laurann Montana, NP    Other Instructions  Please call our office if your symptoms change or worsen prior to your next appointment. We can rescheduled your appointment to sooner if needed. [phone]
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
## 2020-03-20 ENCOUNTER — Telehealth: Payer: Self-pay | Admitting: *Deleted

## 2020-03-20 LAB — T4, FREE: Free T4: 1.28 ng/dL (ref 0.82–1.77)

## 2020-03-20 LAB — CBC
Hematocrit: 40.3 % (ref 34.0–46.6)
Hemoglobin: 13.4 g/dL (ref 11.1–15.9)
MCH: 30.5 pg (ref 26.6–33.0)
MCHC: 33.3 g/dL (ref 31.5–35.7)
MCV: 92 fL (ref 79–97)
Platelets: 234 10*3/uL (ref 150–450)
RBC: 4.4 x10E6/uL (ref 3.77–5.28)
RDW: 12.4 % (ref 11.7–15.4)
WBC: 9.3 10*3/uL (ref 3.4–10.8)

## 2020-03-20 LAB — COMPREHENSIVE METABOLIC PANEL
ALT: 9 IU/L (ref 0–32)
AST: 15 IU/L (ref 0–40)
Albumin/Globulin Ratio: 1.5 (ref 1.2–2.2)
Albumin: 4.5 g/dL (ref 3.7–4.7)
Alkaline Phosphatase: 93 IU/L (ref 44–121)
BUN/Creatinine Ratio: 19 (ref 12–28)
BUN: 18 mg/dL (ref 8–27)
Bilirubin Total: 0.3 mg/dL (ref 0.0–1.2)
CO2: 24 mmol/L (ref 20–29)
Calcium: 9.8 mg/dL (ref 8.7–10.3)
Chloride: 104 mmol/L (ref 96–106)
Creatinine, Ser: 0.95 mg/dL (ref 0.57–1.00)
GFR calc Af Amer: 67 mL/min/{1.73_m2} (ref >59)
GFR calc non Af Amer: 58 mL/min/{1.73_m2} — ABNORMAL LOW (ref >59)
Globulin, Total: 3.1 g/dL (ref 1.5–4.5)
Glucose: 87 mg/dL (ref 65–99)
Potassium: 5.1 mmol/L (ref 3.5–5.2)
Sodium: 141 mmol/L (ref 134–144)
Total Protein: 7.6 g/dL (ref 6.0–8.5)

## 2020-03-20 LAB — MAGNESIUM: Magnesium: 2.3 mg/dL (ref 1.6–2.3)

## 2020-03-20 LAB — TSH: TSH: 1.56 u[IU]/mL (ref 0.450–4.500)

## 2020-03-20 NOTE — Telephone Encounter (Signed)
Spoke with pt's son, Wille Glaser (DPR), notified of message below. Pt's son will notify pt of recc.  Advised he ask pt to call back if she has any further questions.

## 2020-03-20 NOTE — Telephone Encounter (Signed)
-----   Message from Arvil Chaco, PA-C sent at 03/20/2020 12:56 PM EST ----- Regarding: Pharmacy question When you call Holly Harrington about her lab results.   Please also let her know of pharmacy's response regarding Diltiazem: --Memory issues are not associated with diltiazem or calcium channel blockers.   --In fact, they are expected to improve cerebrovascular perfusion in some cases, thus helping with any memory issues.  I would encourage her to reach out to her PCP regarding her recent symptoms. Please have her continue her current dose of diltiazem.  Thank you! JV

## 2020-06-24 ENCOUNTER — Emergency Department: Payer: Medicare PPO

## 2020-06-24 ENCOUNTER — Emergency Department
Admission: EM | Admit: 2020-06-24 | Discharge: 2020-06-24 | Disposition: A | Discharge status: Home / Self Care | Payer: Medicare PPO | Attending: Emergency Medicine | Admitting: Emergency Medicine

## 2020-06-24 ENCOUNTER — Other Ambulatory Visit: Payer: Self-pay

## 2020-06-24 DIAGNOSIS — J45909 Unspecified asthma, uncomplicated: Secondary | ICD-10-CM | POA: Diagnosis not present

## 2020-06-24 DIAGNOSIS — R220 Localized swelling, mass and lump, head: Secondary | ICD-10-CM | POA: Diagnosis present

## 2020-06-24 DIAGNOSIS — Z7982 Long term (current) use of aspirin: Secondary | ICD-10-CM | POA: Diagnosis not present

## 2020-06-24 DIAGNOSIS — Z7951 Long term (current) use of inhaled steroids: Secondary | ICD-10-CM | POA: Diagnosis not present

## 2020-06-24 DIAGNOSIS — L03211 Cellulitis of face: Secondary | ICD-10-CM | POA: Diagnosis not present

## 2020-06-24 LAB — COMPREHENSIVE METABOLIC PANEL
ALT: 14 U/L (ref 0–44)
AST: 19 U/L (ref 15–41)
Albumin: 4.3 g/dL (ref 3.5–5.0)
Alkaline Phosphatase: 77 U/L (ref 38–126)
Anion gap: 7 (ref 5–15)
BUN: 24 mg/dL — ABNORMAL HIGH (ref 8–23)
CO2: 28 mmol/L (ref 22–32)
Calcium: 9.5 mg/dL (ref 8.9–10.3)
Chloride: 102 mmol/L (ref 98–111)
Creatinine, Ser: 0.75 mg/dL (ref 0.44–1.00)
GFR, Estimated: >60 mL/min (ref >60)
Glucose, Bld: 120 mg/dL — ABNORMAL HIGH (ref 70–99)
Potassium: 4.1 mmol/L (ref 3.5–5.1)
Sodium: 137 mmol/L (ref 135–145)
Total Bilirubin: 0.8 mg/dL (ref 0.3–1.2)
Total Protein: 8 g/dL (ref 6.5–8.1)

## 2020-06-24 LAB — CBC WITH DIFFERENTIAL/PLATELET
Abs Immature Granulocytes: 0.04 10*3/uL (ref 0.00–0.07)
Basophils Absolute: 0.1 10*3/uL (ref 0.0–0.1)
Basophils Relative: 1 %
Eosinophils Absolute: 0.6 10*3/uL — ABNORMAL HIGH (ref 0.0–0.5)
Eosinophils Relative: 7 %
HCT: 40.2 % (ref 36.0–46.0)
Hemoglobin: 12.9 g/dL (ref 12.0–15.0)
Immature Granulocytes: 1 %
Lymphocytes Relative: 31 %
Lymphs Abs: 2.4 10*3/uL (ref 0.7–4.0)
MCH: 30.2 pg (ref 26.0–34.0)
MCHC: 32.1 g/dL (ref 30.0–36.0)
MCV: 94.1 fL (ref 80.0–100.0)
Monocytes Absolute: 0.4 10*3/uL (ref 0.1–1.0)
Monocytes Relative: 6 %
Neutro Abs: 4.1 10*3/uL (ref 1.7–7.7)
Neutrophils Relative %: 54 %
Platelets: 198 10*3/uL (ref 150–400)
RBC: 4.27 MIL/uL (ref 3.87–5.11)
RDW: 12.8 % (ref 11.5–15.5)
WBC: 7.6 10*3/uL (ref 4.0–10.5)
nRBC: 0 % (ref 0.0–0.2)

## 2020-06-24 IMAGING — CT CT MAXILLOFACIAL W/ CM
3 series · 16 of 47 positions shown, 19 images · IV contrast (omnipaque)
Comparison: None.

CLINICAL DATA: Redness under the left eye beginning 2 days ago with
mild pain. Diagnosed with possible cellulitis and prescribed
antibiotics without improvement yet. Evaluate for abscess.

EXAM:
CT MAXILLOFACIAL WITH CONTRAST
TECHNIQUE: Multidetector CT imaging of the maxillofacial structures was
performed with intravenous contrast. Multiplanar CT image
reconstructions were also generated.
CONTRAST:  75mL OMNIPAQUE IOHEXOL 300 MG/ML  SOLN

[Series 2: max soft · axial · 0.33mm/px · z∈[-232,-108]mm · 10 of 72 slices shown, 13 images]
[im 5/72  brain]
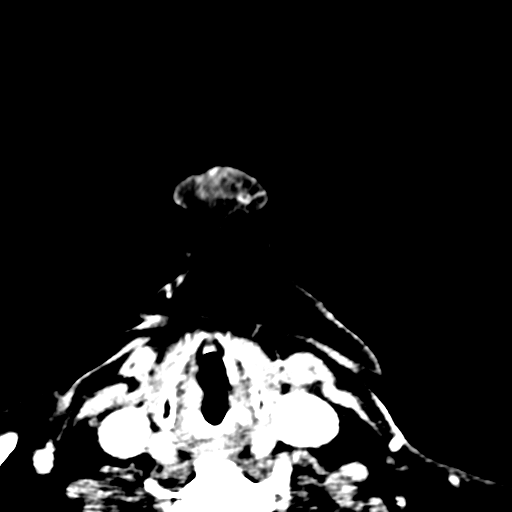
[im 5/72  bone]
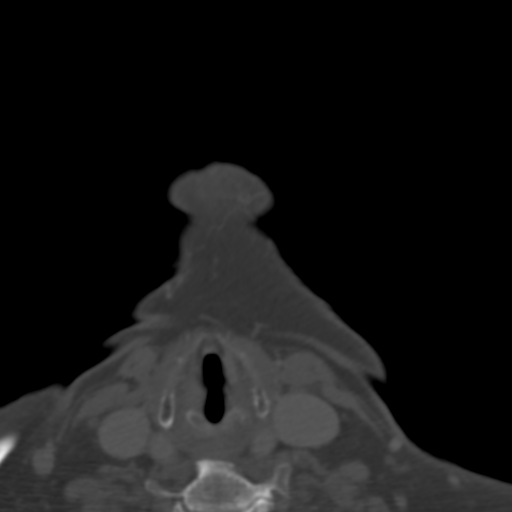
[im 13/72  bone]
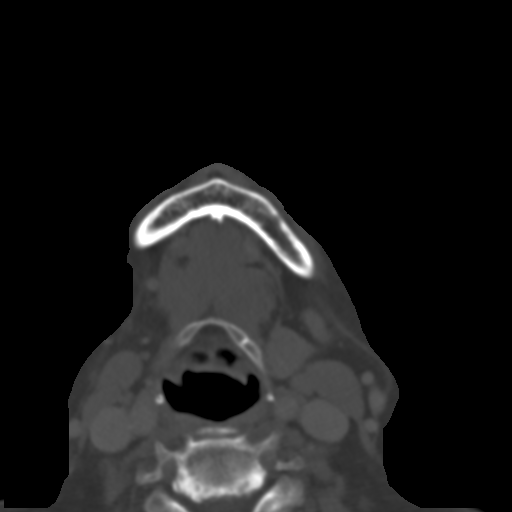
[im 20/72  bone]
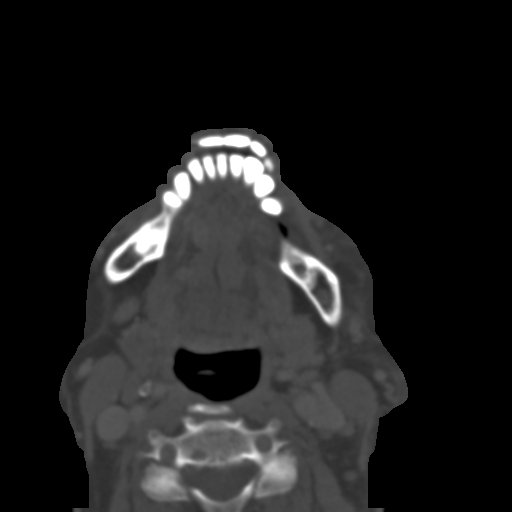
[im 25/72  bone]
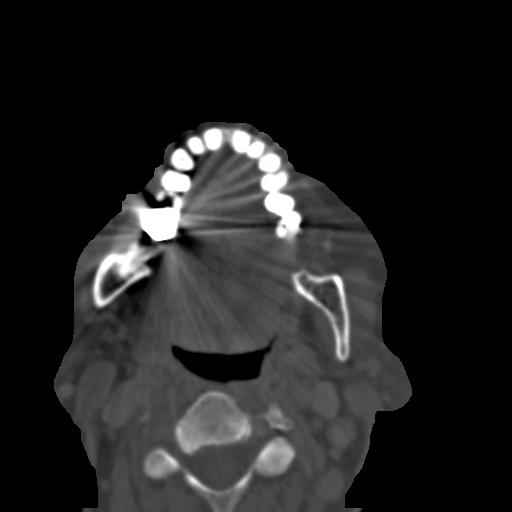
[im 32/72  brain]
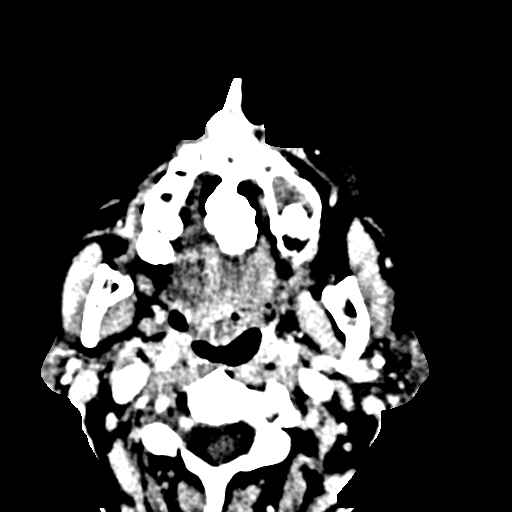
[im 32/72  bone]
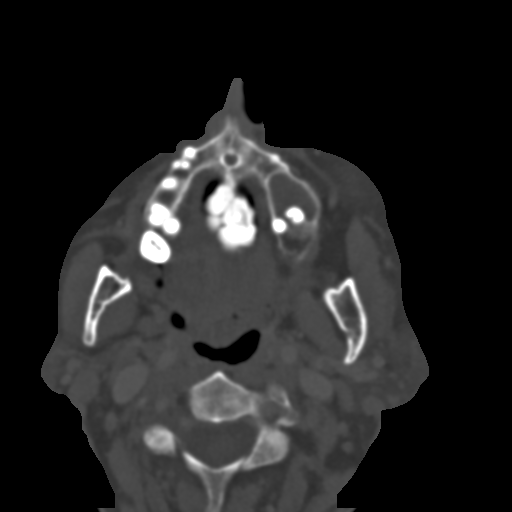
[im 40/72  bone]
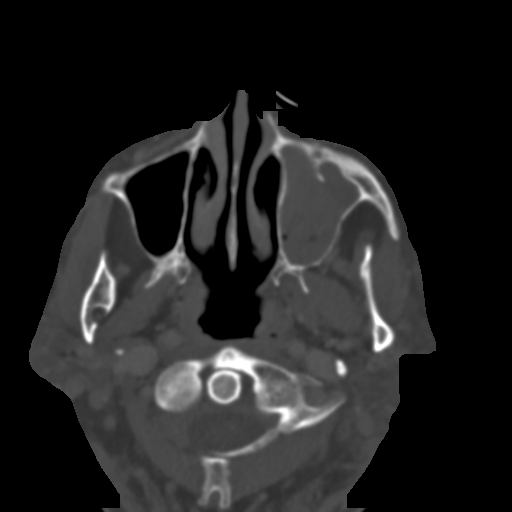
[im 47/72  bone]
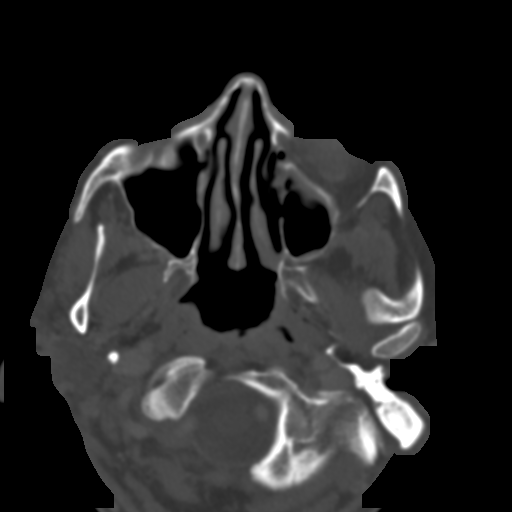
[im 54/72  bone]
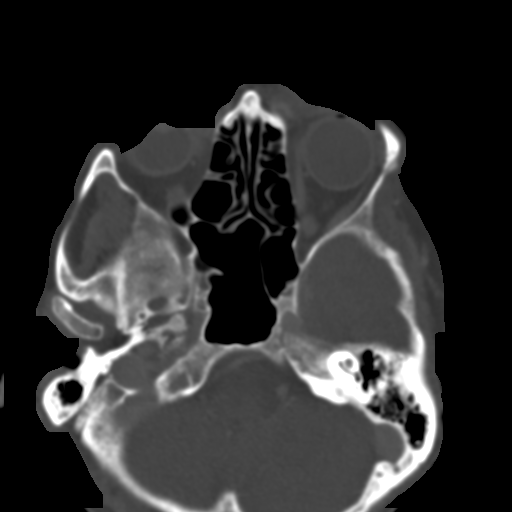
[im 59/72  brain]
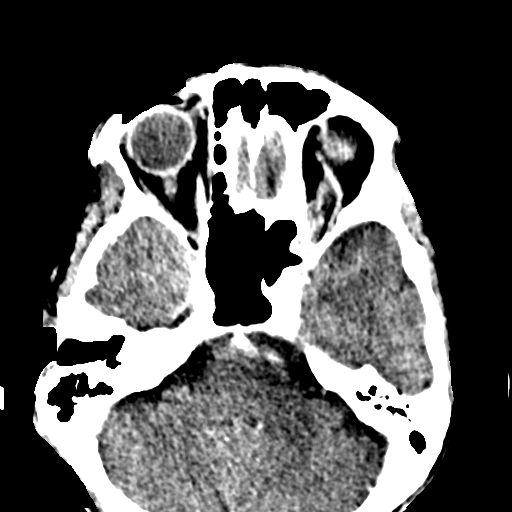
[im 59/72  bone]
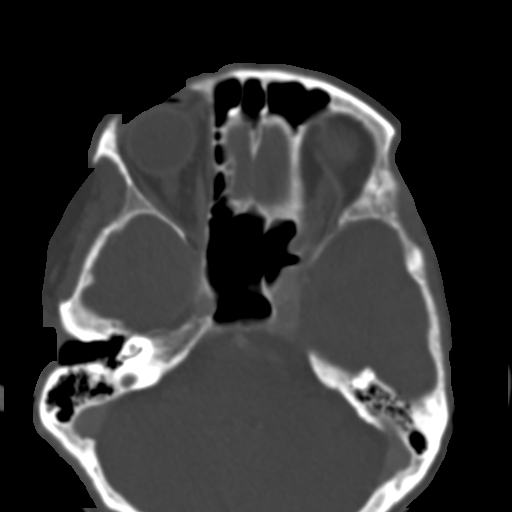
[im 67/72  bone]
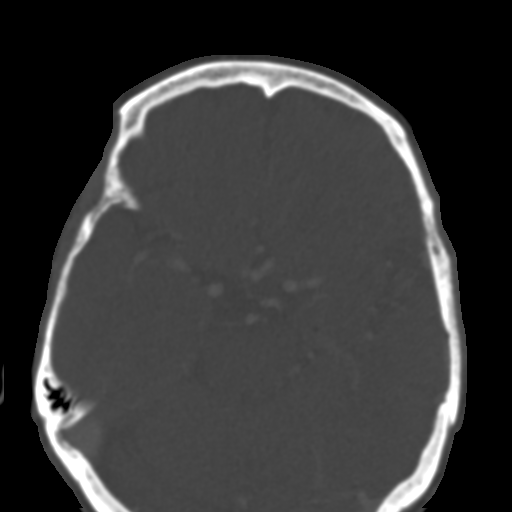

[Series 4: coronal soft · coronal · 0.33mm/px · 3 of 70 slices shown]
[im 24/70  bone]
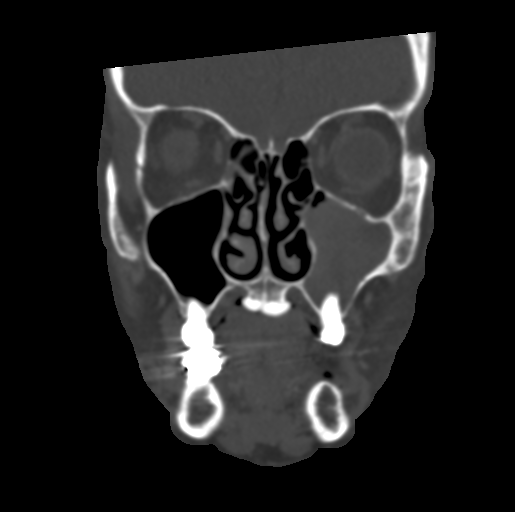
[im 31/70  bone]
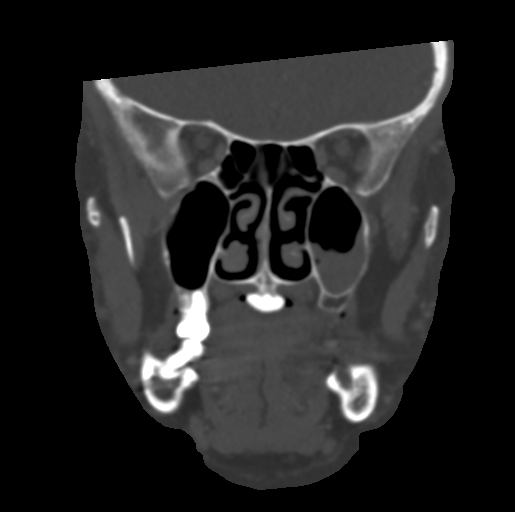
[im 39/70  bone]
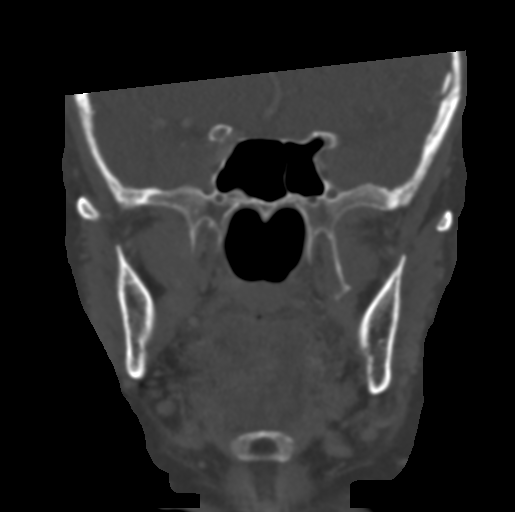

[Series 5: sagittal soft · sagittal · 0.35mm/px · 3 of 79 slices shown]
[im 27/79  bone]
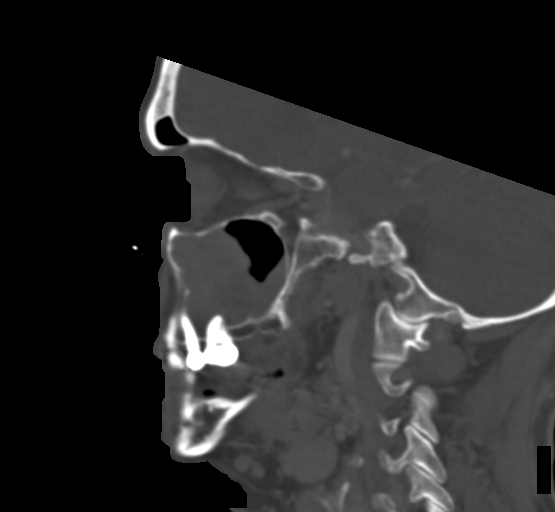
[im 40/79  bone]
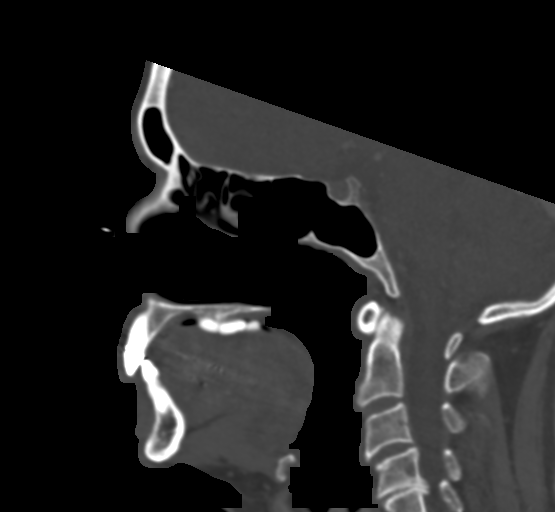
[im 53/79  bone]
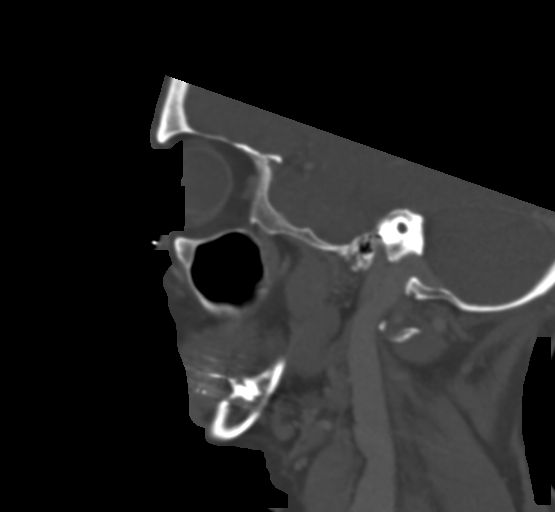

[16 of 47 positions shown; findings below may reference images not displayed]

FINDINGS: Osseous: No acute fracture or suspicious osseous lesion.

Orbits: The globes appear intact. There is preseptal soft tissue
thickening primarily involving the lower eyelid with mild
subcutaneous fat inflammation extending inferiorly into the pre
maxillary region. No postseptal inflammation, fluid collection, or
mass.

Sinuses: Extensive but subtotal opacification of the left maxillary
sinus by mixed density material without frank dehiscence of the
sinus walls or clear extension of inflammation outside of the sinus.
Clear mastoid air cells.

Soft tissues: No additional findings.

Limited intracranial: Partially visualized white matter
hypoattenuation in the region of the frontal horns, incompletely
evaluated and nonspecific though most often seen with chronic small
vessel ischemia.
IMPRESSION: 1. Left infraorbital soft tissue swelling which could reflect
cellulitis. No evidence of postseptal cellulitis or abscess.
2. Left maxillary sinusitis.

## 2020-06-24 MED ORDER — SODIUM CHLORIDE 0.9 % IV SOLN
1.0000 g | Freq: Once | INTRAVENOUS | Status: AC
  Administered 2020-06-24: 1 g via INTRAVENOUS
  Filled 2020-06-24: qty 10

## 2020-06-24 MED ORDER — CEPHALEXIN 500 MG PO CAPS: 500.0000 mg | ORAL_CAPSULE | Freq: Four times a day (QID) | ORAL | 0 refills | Status: AC

## 2020-06-24 MED ORDER — DILTIAZEM HCL 60 MG PO TABS: 120.0000 mg | ORAL_TABLET | Freq: Once | ORAL | Status: DC

## 2020-06-24 MED ORDER — IOHEXOL 300 MG/ML  SOLN
75.0000 mL | Freq: Once | INTRAMUSCULAR | Status: AC | PRN
  Administered 2020-06-24: 75 mL via INTRAVENOUS
  Filled 2020-06-24: qty 75

## 2020-06-24 NOTE — ED Triage Notes (Signed)
Pt comes with c/o skin irritation to left side of face. Pt states she went to PCP and was prescribed antibiotics. Pt states she took 2 doses yesterday and one does today and doesn't feel it is any better.

## 2020-06-24 NOTE — ED Provider Notes (Signed)
[facility]-EMERGENCY DEPARTMENT  ____________________________________________  Time seen: Approximately 6:04 PM  I have reviewed the triage vital signs and the nursing notes.   HISTORY  Chief Complaint Rash   Historian Patient     HPI Holly Harrington is a 75 y.o. female presents to the emergency department with worsening left-sided facial cellulitis.  Patient was seen and evaluated by urgent care yesterday and was prescribed doxycycline.  Patient states that some of her facial swelling has improved but erythema has worsened and she became concerned.  She reports that she occasionally experiences some discomfort when she moves her eyes.  No blurry vision.  She has had 3 doses of doxycycline.  No fever or chills at home.  No prior history of facial cellulitis.   Past Medical History:  Diagnosis Date  . Asthma   . Breast lump   . Corneal ulcer   . Fibroid   . Osteopenia   . Osteoporosis      Immunizations up to date:  Yes.     Past Medical History:  Diagnosis Date  . Asthma   . Breast lump   . Corneal ulcer   . Fibroid   . Osteopenia   . Osteoporosis     Patient Active Problem List   Diagnosis Date Noted  . Chest pain of uncertain etiology 74/03/8785  . Palpitations 10/23/2019  . Osteoporosis   . Breast lump     Past Surgical History:  Procedure Laterality Date  . ANTERIOR AND POSTERIOR VAGINAL REPAIR    . VAGINAL HYSTERECTOMY      Prior to Admission medications   Medication Sig Start Date End Date Taking? Authorizing Provider  cephALEXin (KEFLEX) 500 MG capsule Take 1 capsule (500 mg total) by mouth 4 (four) times daily for 7 days. 06/24/20 07/01/20 Yes Vallarie Mare M, PA-C  albuterol (VENTOLIN HFA) 108 (90 Base) MCG/ACT inhaler Inhale into the lungs. 01/22/20   [provider]  aspirin EC 81 MG tablet Take 1 tablet (81 mg total) by mouth daily. Swallow whole. 10/23/19   End, Harrell Gave, MD  CYANOCOBALAMIN PO Place under the tongue daily.      [provider]  diltiazem (CARDIZEM CD) 120 MG 24 hr capsule Take 1 capsule (120 mg total) by mouth daily. 02/18/20 05/18/20  Marrianne Mood D, PA-C  fluticasone (FLOVENT HFA) 110 MCG/ACT inhaler Inhale into the lungs. 01/22/20   [provider]  melatonin 5 MG TABS Take 5 mg by mouth at bedtime as needed.    [provider]  Multiple Vitamin (MULTIVITAMIN) tablet Take 1 tablet by mouth daily.    [provider]  Vibegron (GEMTESA) 75 MG TABS Take 75 mg by mouth daily.    [provider]  VITAMIN D, CHOLECALCIFEROL, PO Take by mouth daily.    [provider]    Allergies Codeine, Hydrocodone, and Oxycodone  Family History  Problem Relation Age of Onset  . Breast cancer Mother 34  . Breast cancer Maternal Aunt 36  . Hypertension Brother     Social History Social History   Tobacco Use  . Smoking status: Never Smoker  . Smokeless tobacco: Never Used  Vaping Use  . Vaping Use: Never used  Substance Use Topics  . Alcohol use: Yes    Comment: O'Doul's  . Drug use: No     Review of Systems  Constitutional: No fever/chills Eyes:  No discharge ENT: No upper respiratory complaints. Respiratory: no cough. No SOB/ use of accessory muscles to breath  Gastrointestinal:   No nausea, no vomiting.  No diarrhea.  No constipation. Musculoskeletal: Negative for musculoskeletal pain. Skin: Patient has facial cellulitis.    ____________________________________________   PHYSICAL EXAM:  VITAL SIGNS: ED Triage Vitals  Enc Vitals Group     BP 06/24/20 1705 (!) 150/103     Pulse Rate 06/24/20 1705 89     Resp 06/24/20 1705 18     Temp 06/24/20 1705 98.2 F (36.8 C)     Temp Source 06/24/20 1705 Oral     SpO2 --      Weight --      Height --      Head Circumference --      Peak Flow --      Pain Score 06/24/20 1709 3     Pain Loc --      Pain Edu? --      Excl. in [place]? --      Constitutional: Alert and oriented. Well  appearing and in no acute distress. Eyes: Conjunctivae are normal. PERRL. EOMI. Head: Atraumatic. ENT:      Nose: No congestion/rhinnorhea.      Mouth/Throat: Mucous membranes are moist.  Neck: No stridor.  No cervical spine tenderness to palpation. Cardiovascular: Normal rate, regular rhythm. Normal S1 and S2.  Good peripheral circulation. Respiratory: Normal respiratory effort without tachypnea or retractions. Lungs CTAB. Good air entry to the bases with no decreased or absent breath sounds Gastrointestinal: Bowel sounds x 4 quadrants. Soft and nontender to palpation. No guarding or rigidity. No distention. Musculoskeletal: Full range of motion to all extremities. No obvious deformities noted Neurologic:  Normal for age. No gross focal neurologic deficits are appreciated.  Skin: Patient has erythema extending from the left mid face to right below the left inferior orbit. Psychiatric: Mood and affect are normal for age. Speech and behavior are normal.   ____________________________________________   LABS (all labs ordered are listed, but only abnormal results are displayed)  Labs Reviewed  CBC WITH DIFFERENTIAL/PLATELET - Abnormal; Notable for the following components:      Result Value   Eosinophils Absolute 0.6 (*)    All other components within normal limits  COMPREHENSIVE METABOLIC PANEL - Abnormal; Notable for the following components:   Glucose, Bld 120 (*)    BUN 24 (*)    All other components within normal limits   ____________________________________________  EKG   ____________________________________________  RADIOLOGY Unk Pinto, personally viewed and evaluated these images (plain radiographs) as part of my medical decision making, as well as reviewing the written report by the radiologist.    CT Maxillofacial W Contrast  Result Date: 06/24/2020 CLINICAL DATA:  Redness under the left eye beginning 2 days ago with mild pain. Diagnosed with possible  cellulitis and prescribed antibiotics without improvement yet. Evaluate for abscess. EXAM: CT MAXILLOFACIAL WITH CONTRAST TECHNIQUE: Multidetector CT imaging of the maxillofacial structures was performed with intravenous contrast. Multiplanar CT image reconstructions were also generated. CONTRAST:  19mL OMNIPAQUE IOHEXOL 300 MG/ML  SOLN COMPARISON:  None. FINDINGS: Osseous: No acute fracture or suspicious osseous lesion. Orbits: The globes appear intact. There is preseptal soft tissue thickening primarily involving the lower eyelid with mild subcutaneous fat inflammation extending inferiorly into the pre maxillary region. No postseptal inflammation, fluid collection, or mass. Sinuses: Extensive but subtotal opacification of the left maxillary sinus by mixed density material without frank dehiscence of the sinus walls or clear extension of inflammation outside of the sinus. Clear mastoid air cells. Soft  tissues: No additional findings. Limited intracranial: Partially visualized white matter hypoattenuation in the region of the frontal horns, incompletely evaluated and nonspecific though most often seen with chronic small vessel ischemia. IMPRESSION: 1. Left infraorbital soft tissue swelling which could reflect cellulitis. No evidence of postseptal cellulitis or abscess. 2. Left maxillary sinusitis. Electronically Signed   By: Logan Bores M.D.   On: 06/24/2020 20:02    ____________________________________________    PROCEDURES  Procedure(s) performed:     Procedures     Medications  iohexol (OMNIPAQUE) 300 MG/ML solution 75 mL (75 mLs Intravenous Contrast Given 06/24/20 1903)  cefTRIAXone (ROCEPHIN) 1 g in sodium chloride 0.9 % 100 mL IVPB (0 g Intravenous Stopped 06/24/20 2124)     ____________________________________________   INITIAL IMPRESSION / ASSESSMENT AND PLAN / ED COURSE  Pertinent labs & imaging results that were available during my care of the patient were reviewed by me and  considered in my medical decision making (see chart for details).      Assessment and Plan:  Facial cellulitis:  75 year old female presents to the emergency department with worsening facial cellulitis.  Patient was hypertensive at triage but vital signs were otherwise reassuring. CBC and CMP were reassuring.  CT max face shows no signs of facial abscess were orbital cellulitis.  Patient was given IV Rocephin in the emergency department.  Recommended that patient continue taking doxycycline as directed.  Added on Keflex 4 times daily for the next 7 days.  Gave patient strict return precautions to return to the emergency department if symptoms worsen for reevaluation.   ____________________________________________  FINAL CLINICAL IMPRESSION(S) / ED DIAGNOSES  Final diagnoses:  Facial cellulitis      NEW MEDICATIONS STARTED DURING THIS VISIT:  ED Discharge Orders         Ordered    cephALEXin (KEFLEX) 500 MG capsule  4 times daily        06/24/20 2115              This chart was dictated using voice recognition software/Dragon. Despite best efforts to proofread, errors can occur which can change the meaning. Any change was purely unintentional.     Lannie Fields, PA-C 06/24/20 2156    Harvest Dark, MD 06/24/20 2248

## 2020-06-24 NOTE — ED Notes (Signed)
Pr to ED stating woke up 2 days ago with redness under L eye. Pt states has only mild pain. Was told it may be cellulitis. Pt in NAD at this time.

## 2020-06-24 NOTE — ED Notes (Signed)
ED Provider at bedside. 

## 2020-06-24 NOTE — Discharge Instructions (Signed)
Keep taking doxycycline as directed. Take Keflex 4 times daily for the next 7 days.

## 2020-06-25 ENCOUNTER — Ambulatory Visit: Payer: Medicare PPO | Admitting: Physician Assistant

## 2020-06-25 ENCOUNTER — Encounter: Payer: Self-pay | Admitting: Physician Assistant

## 2020-06-25 ENCOUNTER — Telehealth: Payer: Self-pay | Admitting: Internal Medicine

## 2020-06-25 VITALS — BP 134/70 | HR 80 | Ht 63.0 in | Wt 170.0 lb

## 2020-06-25 DIAGNOSIS — I471 Supraventricular tachycardia: Secondary | ICD-10-CM | POA: Diagnosis not present

## 2020-06-25 DIAGNOSIS — I080 Rheumatic disorders of both mitral and aortic valves: Secondary | ICD-10-CM

## 2020-06-25 DIAGNOSIS — I10 Essential (primary) hypertension: Secondary | ICD-10-CM

## 2020-06-25 DIAGNOSIS — R0789 Other chest pain: Secondary | ICD-10-CM | POA: Diagnosis not present

## 2020-06-25 MED ORDER — DILTIAZEM HCL 30 MG PO TABS: 30.0000 mg | ORAL_TABLET | Freq: Four times a day (QID) | ORAL | 5 refills | Status: DC | PRN

## 2020-06-25 NOTE — Telephone Encounter (Signed)
LMOV  

## 2020-06-25 NOTE — Telephone Encounter (Signed)
-----   Message from Arvil Chaco, PA-C sent at 06/24/2020  6:36 PM EDT ----- Regarding: Admitted Currently admitted.  Scheduled OP tomorrow 3/17.  Likely needs rescheduled -just looking ahead!  JV
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
## 2020-06-25 NOTE — Patient Instructions (Addendum)
Medication Instructions:  Your physician has recommended you make the following change in your medication:   START Cardizem 30mg  up to FOUR times daily AS NEEDED - for racing heart rate and/or palpitations  *If you need a refill on your cardiac medications before your next appointment, please call your pharmacy*   Lab Work: None ordered   Testing/Procedures: None ordered   Follow-Up: At [facility], you and your health needs are our priority.  As part of our continuing mission to provide you with exceptional heart care, we have created designated Provider Care Teams.  These Care Teams include your primary Cardiologist (physician) and Advanced Practice Providers (APPs -  Physician Assistants and Nurse Practitioners) who all work together to provide you with the care you need, when you need it.  We recommend signing up for the patient portal called "MyChart".  Sign up information is provided on this After Visit Summary.  MyChart is used to connect with patients for Virtual Visits (Telemedicine).  Patients are able to view lab/test results, encounter notes, upcoming appointments, etc.  Non-urgent messages can be sent to your provider as well.   To learn more about what you can do with MyChart, go to [email or web address].    Your next appointment:   4 - 6 month(s)  The format for your next appointment:   In Person  Provider:   You may see [name], MD or one of the following Advanced Practice Providers on your designated Care Team:    Murray Hodgkins, NP  Christell Faith, PA-C  Marrianne Mood, PA-C  Cadence Kathlen Mody, [place]  Laurann Montana, NP    Other Instructions  --Try to avoid salt, which can lead to fluctuating blood pressure. --Call the office if blood pressure is consistently over 160 (for the top number).  Goal blood pressure is 130/80 or lower. After you recover from your cellulitis, call the office if your top #4 your blood pressure is consistently elevated  above 130. --When taking your as needed short acting Cardizem, you can take up to 4 doses per day.  Please check your blood pressure before taking this medication.  Do not take the short-acting Cardizem if your top number is 100 for your blood pressure or heart rate in the 60s, call the office.  --Call the office if worsening pain of your legs when walking, or if you notice any numbness/tingling, temperature changes, or color changes.  We can perform lower extremity studies at that time, as discussed in clinic. --We will plan to update an echo when we are next in office, so that we can monitor your valve.  As discussed today, symptoms of worsening valvular disease include shortness of breath, chest pain, dizziness, or passing out.

## 2020-08-27 ENCOUNTER — Other Ambulatory Visit: Payer: Self-pay

## 2020-08-27 MED ORDER — DILTIAZEM HCL ER COATED BEADS 120 MG PO CP24: 120.0000 mg | ORAL_CAPSULE | Freq: Every day | ORAL | 3 refills | Status: DC

## 2020-12-30 ENCOUNTER — Encounter: Payer: Self-pay | Admitting: Internal Medicine

## 2020-12-30 ENCOUNTER — Ambulatory Visit: Payer: Medicare PPO | Admitting: Internal Medicine

## 2020-12-30 ENCOUNTER — Other Ambulatory Visit: Payer: Self-pay

## 2020-12-30 VITALS — BP 110/66 | HR 89 | Ht 63.5 in | Wt 170.0 lb

## 2020-12-30 DIAGNOSIS — I340 Nonrheumatic mitral (valve) insufficiency: Secondary | ICD-10-CM

## 2020-12-30 DIAGNOSIS — I080 Rheumatic disorders of both mitral and aortic valves: Secondary | ICD-10-CM | POA: Diagnosis not present

## 2020-12-30 DIAGNOSIS — I10 Essential (primary) hypertension: Secondary | ICD-10-CM

## 2020-12-30 DIAGNOSIS — I471 Supraventricular tachycardia: Secondary | ICD-10-CM

## 2020-12-30 DIAGNOSIS — I351 Nonrheumatic aortic (valve) insufficiency: Secondary | ICD-10-CM

## 2020-12-30 NOTE — Patient Instructions (Signed)
Medication Instructions:   Your physician recommends that you continue on your current medications as directed. Please refer to the Current Medication list given to you today.  *If you need a refill on your cardiac medications before your next appointment, please call your pharmacy*   Lab Work:  None ordered  Testing/Procedures:  Your physician has requested that you have an echocardiogram. Echocardiography is a painless test that uses sound waves to create images of your heart. It provides your doctor with information about the size and shape of your heart and how well your heart's chambers and valves are working. This procedure takes approximately one hour. There are no restrictions for this procedure.   Follow-Up: At [facility], you and your health needs are our priority.  As part of our continuing mission to provide you with exceptional heart care, we have created designated Provider Care Teams.  These Care Teams include your primary Cardiologist (physician) and Advanced Practice Providers (APPs -  Physician Assistants and Nurse Practitioners) who all work together to provide you with the care you need, when you need it.  We recommend signing up for the patient portal called "MyChart".  Sign up information is provided on this After Visit Summary.  MyChart is used to connect with patients for Virtual Visits (Telemedicine).  Patients are able to view lab/test results, encounter notes, upcoming appointments, etc.  Non-urgent messages can be sent to your provider as well.   To learn more about what you can do with MyChart, go to [email or web address].    Your next appointment:   2 month(s)  The format for your next appointment:   In Person  Provider:   You may see [name], MD or one of the following Advanced Practice Providers on your designated Care Team:   Murray Hodgkins, NP Christell Faith, PA-C Marrianne Mood, PA-C Cadence [place], [place]

## 2020-12-30 NOTE — Progress Notes (Signed)
Follow-up Outpatient Visit Date: 12/30/2020  Primary Care Provider: Azucena Freed, MD [place] [id number]  Chief Complaint: Follow-up PSVT and mixed valvular heart disease  HPI:  Holly Harrington is a 75 y.o. female with history of PSVT, valvular heart disease (mild-moderate mitral and aortic regurgitation by echo in 01/2020), obstructive sleep apnea, hypertension, asthma, and osteoporosis, who presents for follow-up of PSVT and hypertension.  She was last seen in our office in March by Marrianne Mood, [place], in March.  At that time, she continued to complain of fatigue but reported that her thinking and memory seemed better following diagnosis of obstructive sleep apnea and initiation of CPAP.  Today, Holly Harrington reports that she frequently feels "tired and achy."  This improves with an afternoon nap.  She denies frank shortness of breath as well as chest pain.  Overall, her energy is better since she began using CPAP.  She has not had any palpitations or lightheadedness.  She has occasional leg edema.  --------------------------------------------------------------------------------------------------  Past Medical History:  Diagnosis Date   Asthma    Breast lump    Corneal ulcer    Fibroid    Hypertension    Obstructive sleep apnea    Osteopenia    Osteoporosis    PSVT (paroxysmal supraventricular tachycardia) (HCC)    Valvular heart disease 01/2020   Mild-moderate mitral and aortic regurgitation   Past Surgical History:  Procedure Laterality Date   ANTERIOR AND POSTERIOR VAGINAL REPAIR     VAGINAL HYSTERECTOMY       Recent CV Pertinent Labs: Lab Results  Component Value Date   K 4.1 06/24/2020   MG 2.3 03/19/2020   BUN 24 (H) 06/24/2020   BUN 18 03/19/2020   CREATININE 0.75 06/24/2020    Past medical and surgical history were reviewed and updated in EPIC.  Current Meds  Medication Sig   aspirin EC 81 MG tablet Take 1 tablet (81 mg  total) by mouth daily. Swallow whole.   diltiazem (CARDIZEM CD) 120 MG 24 hr capsule Take 1 capsule (120 mg total) by mouth daily.   diltiazem (CARDIZEM) 30 MG tablet Take 1 tablet (30 mg total) by mouth 4 (four) times daily as needed (racing heart rate or palpitations).   fluticasone (FLOVENT HFA) 110 MCG/ACT inhaler Inhale into the lungs.   melatonin 5 MG TABS Take 5 mg by mouth at bedtime as needed.   Vibegron (GEMTESA) 75 MG TABS Take 75 mg by mouth daily.   VITAMIN D, CHOLECALCIFEROL, PO Take by mouth daily.    Allergies: Codeine, Hydrocodone, and Oxycodone  Social History   Tobacco Use   Smoking status: Never   Smokeless tobacco: Never  Vaping Use   Vaping Use: Never used  Substance Use Topics   Alcohol use: Yes    Comment: O'Doul's occasionally   Drug use: No    Family History  Problem Relation Age of Onset   Breast cancer Mother 101   Hypertension Brother    Breast cancer Maternal Aunt 36    Review of Systems: A 12-system review of systems was performed and was negative except as noted in the HPI.  --------------------------------------------------------------------------------------------------  Physical Exam: BP 110/66 (BP Location: Left Arm, Patient Position: Sitting, Cuff Size: Large)   Pulse 89   Ht 5' 3.5" (1.613 m)   Wt 170 lb (77.1 kg)   SpO2 96%   BMI 29.64 kg/m   General:  NAD. Neck: No JVD or HJR. Lungs: Clear to  auscultation bilaterally without wheezes or crackles. Heart: Regular rate and rhythm with 1/6 systolic murmur.  No rubs or gallops. Abdomen: Soft, nontender, nondistended. Extremities: No lower extremity edema.  EKG: Normal sinus rhythm with borderline LVH.  No significant abnormality.  Lab Results  Component Value Date   WBC 7.6 06/24/2020   HGB 12.9 06/24/2020   HCT 40.2 06/24/2020   MCV 94.1 06/24/2020   PLT 198 06/24/2020    Lab Results  Component Value Date   NA 137 06/24/2020   K 4.1 06/24/2020   CL 102 06/24/2020    CO2 28 06/24/2020   BUN 24 (H) 06/24/2020   CREATININE 0.75 06/24/2020   GLUCOSE 120 (H) 06/24/2020   ALT 14 06/24/2020    No results found for: CHOL, HDL, LDLCALC, LDLDIRECT, TRIG, CHOLHDL  --------------------------------------------------------------------------------------------------  ASSESSMENT AND PLAN: PSVT: No further palpitations reported.  Continue standing diltiazem.  Patient has not needed as needed diltiazem.  Valvular heart disease and fatigue: Prior echo almost a year ago showed mild-moderate mitral and aortic regurgitation.  I do not think that the degree of valvular heart disease explains her fatigue, which has improved somewhat with regular use of CPAP.  I have encouraged Holly Harrington to continue using her CPAP and to try to increase her activity in an effort to improve her stamina.  We will plan to repeat an echo at her convenience to ensure that her valvular heart disease has not worsened and that there is no evidence of developing cardiomyopathy.  Hypertension: BP well controlled today.  Continue low-dose diltiazem.  Follow-up: Return to clinic in 2 months.  [name], MD 12/30/2020 4:18 PM

## 2021-01-01 ENCOUNTER — Encounter: Payer: Self-pay | Admitting: Internal Medicine

## 2021-01-01 DIAGNOSIS — I080 Rheumatic disorders of both mitral and aortic valves: Secondary | ICD-10-CM | POA: Insufficient documentation

## 2021-01-01 DIAGNOSIS — I471 Supraventricular tachycardia: Secondary | ICD-10-CM | POA: Insufficient documentation

## 2021-01-01 DIAGNOSIS — I10 Essential (primary) hypertension: Secondary | ICD-10-CM | POA: Insufficient documentation

## 2021-01-28 ENCOUNTER — Ambulatory Visit (INDEPENDENT_AMBULATORY_CARE_PROVIDER_SITE_OTHER): Payer: Medicare PPO

## 2021-01-28 ENCOUNTER — Other Ambulatory Visit: Payer: Self-pay

## 2021-01-28 DIAGNOSIS — I471 Supraventricular tachycardia: Secondary | ICD-10-CM

## 2021-01-28 DIAGNOSIS — I080 Rheumatic disorders of both mitral and aortic valves: Secondary | ICD-10-CM | POA: Diagnosis not present

## 2021-01-28 LAB — ECHOCARDIOGRAM COMPLETE
AR max vel: 2.56 cm2
AV Area VTI: 2.79 cm2
AV Area mean vel: 2.68 cm2
AV Mean grad: 4 mmHg
AV Peak grad: 7.8 mmHg
AV Vena cont: 0.2 cm
Ao pk vel: 1.4 m/s
Area-P 1/2: 4.1 cm2
Calc EF: 67.8 %
MV M vel: 5.74 m/s
MV Peak grad: 131.8 mmHg
MV VTI: 2.71 cm2
P 1/2 time: 445 msec
S' Lateral: 2.9 cm
Single Plane A2C EF: 64.6 %
Single Plane A4C EF: 71.3 %

## 2021-02-01 ENCOUNTER — Telehealth: Payer: Self-pay

## 2021-02-01 NOTE — Telephone Encounter (Signed)
Able to reach pt regarding her recent ECHO, Dr. Saunders Revel had a chance to review her results and advised   "Please let Holly Harrington know that her heart is contracting normally.  The leakage of her heart valves, particularly the mitral and tricuspid valves, appears to have worsened some.  We will need to consider further assessment when she returns for follow-up, which may include a transesophageal echocardiogram.  For the time being, she should continue her current medications and follow-up with [place] as planned next month.  If new or worsening symptoms develop in the meantime, she should let us know. "  Holly Harrington very thankful for the phone call of her results, all questions concerns were address with nothing further at this time. Explained TEE, advised will discuss further at her f/u appt. Will see at next schedule f/u appt on 11/21 at 3:25 pm.

## 2021-03-01 ENCOUNTER — Other Ambulatory Visit: Payer: Self-pay

## 2021-03-01 ENCOUNTER — Ambulatory Visit: Payer: Medicare PPO | Admitting: Physician Assistant

## 2021-03-01 ENCOUNTER — Encounter: Payer: Self-pay | Admitting: Physician Assistant

## 2021-03-01 VITALS — BP 138/80 | HR 66 | Ht 63.0 in | Wt 169.0 lb

## 2021-03-01 DIAGNOSIS — I10 Essential (primary) hypertension: Secondary | ICD-10-CM | POA: Diagnosis not present

## 2021-03-01 DIAGNOSIS — I080 Rheumatic disorders of both mitral and aortic valves: Secondary | ICD-10-CM | POA: Diagnosis not present

## 2021-03-01 DIAGNOSIS — I471 Supraventricular tachycardia: Secondary | ICD-10-CM

## 2021-03-01 DIAGNOSIS — G4733 Obstructive sleep apnea (adult) (pediatric): Secondary | ICD-10-CM

## 2021-03-01 DIAGNOSIS — Z9989 Dependence on other enabling machines and devices: Secondary | ICD-10-CM

## 2021-03-01 MED ORDER — FUROSEMIDE 20 MG PO TABS: 20.0000 mg | ORAL_TABLET | Freq: Every day | ORAL | 3 refills | Status: DC

## 2021-03-01 NOTE — Progress Notes (Signed)
Cardiology Office Note    Date:  03/01/2021   ID:  Holly Harrington, DOB 12-31-1945, MRN [id number]  PCP:  Azucena Freed, MD  Cardiologist:  [name], MD  Electrophysiologist:  None   Chief Complaint: Follow-up  History of Present Illness:   Holly Harrington is a 75 y.o. female with history of paroxysmal SVT, valvular heart disease with moderate to severe mitral regurgitation and mild to moderate aortic regurgitation, HTN, OSA, asthma, and osteoporosis who presents for follow-up of echo.  Prior ETT in 11/2019 demonstrated decreased exercise capacity with hypertensive blood pressure response and was overall intermediate risk due to limited functional capacity.  There were no ischemic EKG changes noted at workload achieved.  Outpatient cardiac monitoring in 11/2019 demonstrated a predominant rhythm of sinus with an average rate of 79 bpm, 21 atrial runs lasting up to 14.1 seconds with a maximum rate of 160 bpm, no sustained arrhythmias or prolonged pauses were noted, and patient triggered events corresponded to sinus rhythm with PACs.  Echo in 01/2020 showed an EF of 55 to 60%, no regional wall motion abnormalities, mild LVH, grade 2 diastolic dysfunction, normal RV systolic function and ventricular cavity size, normal PASP, moderately dilated left atrium, mild to moderate mitral regurgitation, mild to moderate aortic insufficiency, and an estimated right atrial pressure of 3 mmHg.  She was last seen in the office in 12/2020 and reported feeling frequently "tired and achy."  The symptoms typically improved with an afternoon nap.  Overall, her energy was better since initiating CPAP therapy.  Repeat echo in 01/2021, demonstrated an EF of 60 to 65%, no regional wall motion abnormalities, grade 2 diastolic dysfunction, normal RV systolic function and ventricular cavity size, mildly elevated PASP estimated at 38.2 mmHg, moderate to severe mitral regurgitation (possibly severe), possible mild  mitral valve prolapse of the posterior leaflet, mild to moderate aortic insufficiency (possibly moderate), mild to moderate tricuspid regurgitation, and an estimated right atrial pressure of 3 mmHg.  She comes in doing well today.  She denies chest pain, dyspnea, palpitations, dizziness, presyncope, or syncope.  She feels like she is at her baseline.  She continues to tolerate CPAP.  No lower extremity swelling.  She does sleep with multiple pillows, though denies orthopnea.  She would prefer to avoid TEE if possible.  Her weight remains stable.  She does not have any active issues or concerns at this time.    Labs independently reviewed: 08/2020 - Hgb 12.6, PLT 194 06/2020 - potassium 4.1, BUN 24, serum creatinine 0.75, albumin 4.3, AST/ALT normal 03/2020 - magnesium 2.3, TSH normal  Past Medical History:  Diagnosis Date   Asthma    Breast lump    Corneal ulcer    Fibroid    Hypertension    Obstructive sleep apnea    Osteopenia    Osteoporosis    PSVT (paroxysmal supraventricular tachycardia) (HCC)    Valvular heart disease 01/2020   Mild-moderate mitral and aortic regurgitation    Past Surgical History:  Procedure Laterality Date   ANTERIOR AND POSTERIOR VAGINAL REPAIR     VAGINAL HYSTERECTOMY      Current Medications: Current Meds  Medication Sig   aspirin EC 81 MG tablet Take 1 tablet (81 mg total) by mouth daily. Swallow whole.   diltiazem (CARDIZEM CD) 120 MG 24 hr capsule Take 1 capsule (120 mg total) by mouth daily.   diltiazem (CARDIZEM) 30 MG tablet Take 1 tablet (30 mg total) by mouth 4 (four)  times daily as needed (racing heart rate or palpitations).   fluticasone (FLOVENT HFA) 110 MCG/ACT inhaler Inhale into the lungs as needed.   furosemide (LASIX) 20 MG tablet Take 1 tablet (20 mg total) by mouth daily.   Vibegron (GEMTESA) 75 MG TABS Take 75 mg by mouth daily.    Allergies:   Codeine, Hydrocodone, and Oxycodone   Social History   Socioeconomic History    Marital status: Unknown    Spouse name: Not on file   Number of children: Not on file   Years of education: Not on file   Highest education level: Not on file  Occupational History   Not on file  Tobacco Use   Smoking status: Never   Smokeless tobacco: Never  Vaping Use   Vaping Use: Never used  Substance and Sexual Activity   Alcohol use: Yes    Comment: O'Doul's occasionally   Drug use: No   Sexual activity: Not Currently    Birth control/protection: Surgical    Comment: hyst  Other Topics Concern   Not on file  Social History Narrative   Not on file   Social Determinants of Health   Financial Resource Strain: Not on file  Food Insecurity: Not on file  Transportation Needs: Not on file  Physical Activity: Not on file  Stress: Not on file  Social Connections: Not on file     Family History:  The patient's family history includes Breast cancer (age of onset: 41) in her maternal aunt; Breast cancer (age of onset: 57) in her mother; Hypertension in her brother.  ROS:   Full 12-point review of systems is negative unless otherwise noted in the HPI.   EKGs/Labs/Other Studies Reviewed:    Studies reviewed were summarized above. The additional studies were reviewed today:  2D echo 01/2021: 1. Left ventricular ejection fraction, by estimation, is 60 to 65%. The  left ventricle has normal function. The left ventricle has no regional  wall motion abnormalities. Left ventricular diastolic parameters are  consistent with Grade II diastolic  dysfunction (pseudonormalization). The average left ventricular global  longitudinal strain is -16.2 %.   2. Right ventricular systolic function is normal. The right ventricular  size is normal. There is mildly elevated pulmonary artery systolic  pressure. The estimated right ventricular systolic pressure is 12.4 mmHg.   3. The mitral valve is normal in structure.   4. Moderate to Severe mitral valve regurgitation (possibly severe). No   evidence of mitral stenosis. Possible mild prolapse of the posterior  leaflet.   5. The aortic valve is normal in structure. Aortic valve regurgitation is  mmild to moderate, possibly moderate. No aortic stenosis is present.   6. aortic root, measuring 36 mm. ascending aorta, measuring 36 mm.   7. The inferior vena cava is normal in size with greater than 50%  respiratory variability, suggesting right atrial pressure of 3 mmHg.   8. Tricuspid valve regurgitation is moderate to severe.   Comparison(s): LVEF 55-60%, Mild-mod MR, Mild-Mod AI.  ___________  2D echo 01/2020: 1. Left ventricular ejection fraction, by estimation, is 55 to 60%. The  left ventricle has normal function. The left ventricle has no regional  wall motion abnormalities. There is mild left ventricular hypertrophy.  Left ventricular diastolic parameters  are consistent with Grade II diastolic dysfunction (pseudonormalization).   2. Right ventricular systolic function is normal. The right ventricular  size is normal. There is normal pulmonary artery systolic pressure.   3. Left atrial size  was moderately dilated.   4. The mitral valve is degenerative. Mild to moderate mitral valve  regurgitation.   5. The aortic valve is tricuspid. Aortic valve regurgitation is mild to  moderate.   6. The inferior vena cava is normal in size with greater than 50%  respiratory variability, suggesting right atrial pressure of 3 mmHg. __________  Elwyn Reach patch 11/2019: The patient was monitored for 14 days. The predominant rhythm was sinus with an average rate of 79 bpm (range 54-130 bpm in sinus). There were rare PAC's and PVC's. 26 atrial runs lasting up to 14.1 seconds with a maximum rate of 160 bpm occurred. No sustained arrhythmia or prolonged pause was observed. Patient triggered event corresponds to sinus rhythm with PAC's.   Predominantly sinus rhythm with rare PAC's and PVC's.  Multiple runs of PSVT noted, lasting up to 14  seconds. __________  ETT 11/2019: Baseline EKG demonstrates normal sinus rhythm without significant abnormality. The patient demonstrates decreased exercise capacity with hypertensive blood pressure response. No angina was reported. There were no diagnostic ST segment or T wave changes during stress or recovery. Isolated PVC was observed during recovery. There were no significant arrhythmias. Intermediate risk exercise tolerance test (Duke Treadmill Score = 3).   Intermediate risk exercise tolerance test due to limited functional capacity.  No ischemic EKG changes noted at workload achieved.    EKG:  EKG is ordered today.  The EKG ordered today demonstrates NSR, 66 bpm, LVH, poor R wave progression along the precordial leads, no acute ST-T changes, when compared to prior tracing no significant changes  Recent Labs: 03/19/2020: Magnesium 2.3; TSH 1.560 06/24/2020: ALT 14; BUN 24; Creatinine, Ser 0.75; Hemoglobin 12.9; Platelets 198; Potassium 4.1; Sodium 137  Recent Lipid Panel No results found for: CHOL, TRIG, HDL, CHOLHDL, VLDL, LDLCALC, LDLDIRECT  PHYSICAL EXAM:    VS:  BP 138/80 (BP Location: Left Arm, Patient Position: Sitting, Cuff Size: Large)   Pulse 66   Ht 5\' 3"  (1.6 m)   Wt 169 lb (76.7 kg)   SpO2 96%   BMI 29.94 kg/m   BMI: Body mass index is 29.94 kg/m.  Physical Exam Constitutional:      Appearance: She is well-developed.  HENT:     Head: Normocephalic and atraumatic.  Eyes:     General:        Right eye: No discharge.        Left eye: No discharge.  Neck:     Vascular: No JVD.  Cardiovascular:     Rate and Rhythm: Normal rate and regular rhythm.     Pulses:          Posterior tibial pulses are 2+ on the right side and 2+ on the left side.     Heart sounds: S1 normal and S2 normal. Heart sounds not distant. No midsystolic click and no opening snap. Murmur heard.  High-pitched blowing holosystolic murmur is present with a grade of 1/6 at the apex.    No  friction rub.  Pulmonary:     Effort: Pulmonary effort is normal. No respiratory distress.     Breath sounds: Normal breath sounds. No decreased breath sounds, wheezing or rales.  Chest:     Chest wall: No tenderness.  Abdominal:     General: There is no distension.     Palpations: Abdomen is soft.     Tenderness: There is no abdominal tenderness.  Musculoskeletal:     Cervical back: Normal range of motion.  Right lower leg: No edema.     Left lower leg: No edema.  Skin:    General: Skin is warm and dry.     Nails: There is no clubbing.  Neurological:     Mental Status: She is alert and oriented to person, place, and time.  Psychiatric:        Speech: Speech normal.        Behavior: Behavior normal.        Thought Content: Thought content normal.        Judgment: Judgment normal.    Wt Readings from Last 3 Encounters:  03/01/21 169 lb (76.7 kg)  12/30/20 170 lb (77.1 kg)  06/25/20 170 lb (77.1 kg)     ASSESSMENT & PLAN:   Valvular heart disease: Most recent echo from 01/2021 demonstrated progression of her valvular heart disease with moderate to severe mitral regurgitation with mild to moderate aortic insufficiency and moderate to severe tricuspid regurgitation.  She reports that she feels very well at this time and is back to her baseline.  She would prefer to avoid TEE after informed consent discussion.  Given this, we will elect a trial of medical therapy with a brief course of Lasix 20 mg daily with a follow-up BMP in 1 week.  Could also look to initiate low-dose ARB in follow-up.  Should she develop the symptoms, TEE could be revisited at that time.  Paroxysmal SVT: Quiescent.  She remains on Cardizem CD with as needed short acting diltiazem.  HTN: Blood pressure is reasonably controlled in the office today.  She remains on Cardizem CD as outlined above.  OSA: Remains on CPAP.   Disposition: F/u with Dr. Saunders Revel or an APP in 6 weeks.   Medication Adjustments/Labs and  Tests Ordered: Current medicines are reviewed at length with the patient today.  Concerns regarding medicines are outlined above. Medication changes, Labs and Tests ordered today are summarized above and listed in the Patient Instructions accessible in Encounters.   Signed, Christell Faith, PA-C 03/01/2021 4:48 PM     [place] [address] [place], [place] [id number] [phone]

## 2021-03-01 NOTE — Patient Instructions (Signed)
Medication Instructions:  Your physician has recommended you make the following change in your medication:   START Furosemide 20 mg once a day  *If you need a refill on your cardiac medications before your next appointment, please call your pharmacy*   Lab Work: BMET in one week here in our office.  If you have labs (blood work) drawn today and your tests are completely normal, you will receive your results only by: [place] (if you have MyChart) OR A paper copy in the mail If you have any lab test that is abnormal or we need to change your treatment, we will call you to review the results.   Testing/Procedures: None   Follow-Up: At [facility], you and your health needs are our priority.  As part of our continuing mission to provide you with exceptional heart care, we have created designated Provider Care Teams.  These Care Teams include your primary Cardiologist (physician) and Advanced Practice Providers (APPs -  Physician Assistants and Nurse Practitioners) who all work together to provide you with the care you need, when you need it.   Your next appointment:   6 week(s)  The format for your next appointment:   In Person  Provider:   [name], MD or Christell Faith, PA-C

## 2021-03-08 ENCOUNTER — Other Ambulatory Visit: Payer: Medicare PPO

## 2021-03-16 ENCOUNTER — Other Ambulatory Visit (INDEPENDENT_AMBULATORY_CARE_PROVIDER_SITE_OTHER): Payer: Medicare PPO

## 2021-03-16 ENCOUNTER — Other Ambulatory Visit: Payer: Self-pay

## 2021-03-16 DIAGNOSIS — I080 Rheumatic disorders of both mitral and aortic valves: Secondary | ICD-10-CM

## 2021-03-16 DIAGNOSIS — I471 Supraventricular tachycardia: Secondary | ICD-10-CM | POA: Diagnosis not present

## 2021-03-16 DIAGNOSIS — I10 Essential (primary) hypertension: Secondary | ICD-10-CM

## 2021-03-17 LAB — BASIC METABOLIC PANEL
BUN/Creatinine Ratio: 24 (ref 12–28)
BUN: 17 mg/dL (ref 8–27)
CO2: 26 mmol/L (ref 20–29)
Calcium: 9.6 mg/dL (ref 8.7–10.3)
Chloride: 101 mmol/L (ref 96–106)
Creatinine, Ser: 0.7 mg/dL (ref 0.57–1.00)
Glucose: 98 mg/dL (ref 70–99)
Potassium: 4.6 mmol/L (ref 3.5–5.2)
Sodium: 140 mmol/L (ref 134–144)
eGFR: 89 mL/min/{1.73_m2} (ref >59)

## 2021-04-21 NOTE — Progress Notes (Signed)
Cardiology Office Note    Date:  04/23/2021   ID:  Holly Harrington, DOB 07-Apr-1946, MRN [id number]  PCP:  Azucena Freed, MD  Cardiologist:  [name], MD  Electrophysiologist:  None   Chief Complaint: Follow up  History of Present Illness:   Holly Harrington is a 76 y.o. 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   Prior ETT in 11/2019 demonstrated decreased exercise capacity with hypertensive blood pressure response and was overall intermediate risk due to limited functional capacity.  There were no ischemic EKG changes noted at workload achieved.  Outpatient cardiac monitoring in 11/2019 demonstrated a predominant rhythm of sinus with an average rate of 79 bpm, 21 atrial runs lasting up to 14.1 seconds with a maximum rate of 160 bpm, no sustained arrhythmias or prolonged pauses were noted, and patient triggered events corresponded to sinus rhythm with PACs.  Echo in 01/2020 showed an EF of 55 to 60%, no regional wall motion abnormalities, mild LVH, grade 2 diastolic dysfunction, normal RV systolic function and ventricular cavity size, normal PASP, moderately dilated left atrium, mild to moderate mitral regurgitation, mild to moderate aortic insufficiency, and an estimated right atrial pressure of 3 mmHg.   She was seen in the office in 12/2020 and reported feeling frequently "tired and achy."  The symptoms typically improved with an afternoon nap.  Overall, her energy was better since initiating CPAP therapy.  Repeat echo in 01/2021, demonstrated an EF of 60 to 65%, no regional wall motion abnormalities, grade 2 diastolic dysfunction, normal RV systolic function and ventricular cavity size, mildly elevated PASP estimated at 38.2 mmHg, moderate to severe mitral regurgitation (possibly  severe), possible mild mitral valve prolapse of the posterior leaflet, mild to moderate aortic insufficiency (possibly moderate), mild to moderate tricuspid regurgitation, and an estimated right atrial pressure of 3 mmHg.  She was last seen in the office on 03/01/2021 and was doing well from a cardiac perspective.  She preferred to avoid TEE.  Given this, we elected a trial of medical therapy with a brief course of Lasix.   She comes in doing reasonably well from a cardiac perspective.  She notes she did take Lasix for approximately 1 week after her last visit, though cannot recall if she felt any different following outpatient diuresis.  She reports having developed left-sided chest discomfort earlier this week that was constant, lasting for 2 to 3 days without associated symptoms followed by spontaneous resolution.  Symptoms were nonexertional.  She has been symptom-free since.  She reports she has previously had similar symptoms, but is unable to recall when these were.  She continues to struggle with adjusting to her CPAP.  With this, she is not sleeping well.  No symptoms concerning for SVT recurrence.  She continues to decline TEE.   Labs independently reviewed: 03/2021 - BUN 17, SCr 0.70, potassium 4.6 09/2020 - HGB 12.6, PLT 194 06/2020 - potassium 4.1, BUN 24, serum creatinine 0.75, albumin 4.3, AST/ALT normal 03/2020 - magnesium 2.3, TSH normal  Past Medical History:  Diagnosis Date   Asthma    Breast lump    Corneal ulcer    Fibroid    Hypertension    Obstructive sleep apnea    Osteopenia    Osteoporosis    PSVT (paroxysmal supraventricular tachycardia) ([place])  Valvular heart disease 01/2020   Mild-moderate mitral and aortic regurgitation    Past Surgical History:  Procedure Laterality Date   ANTERIOR AND POSTERIOR VAGINAL REPAIR     VAGINAL HYSTERECTOMY      Current Medications: Current Meds  Medication Sig   aspirin EC 81 MG tablet Take 1 tablet (81 mg total) by mouth  daily. Swallow whole.   diltiazem (CARDIZEM CD) 120 MG 24 hr capsule Take 1 capsule (120 mg total) by mouth daily.   diltiazem (CARDIZEM) 30 MG tablet Take 1 tablet (30 mg total) by mouth 4 (four) times daily as needed (racing heart rate or palpitations).   fluticasone (FLOVENT HFA) 110 MCG/ACT inhaler Inhale into the lungs as needed.   losartan (COZAAR) 25 MG tablet Take 0.5 tablets (12.5 mg total) by mouth daily.   Vibegron (GEMTESA) 75 MG TABS Take 75 mg by mouth daily.   [DISCONTINUED] furosemide (LASIX) 20 MG tablet Take 1 tablet (20 mg total) by mouth daily.    Allergies:   Codeine, Hydrocodone, and Oxycodone   Social History   Socioeconomic History   Marital status: Unknown    Spouse name: Not on file   Number of children: Not on file   Years of education: Not on file   Highest education level: Not on file  Occupational History   Not on file  Tobacco Use   Smoking status: Never   Smokeless tobacco: Never  Vaping Use   Vaping Use: Never used  Substance and Sexual Activity   Alcohol use: Yes    Comment: O'Doul's occasionally   Drug use: No   Sexual activity: Not Currently    Birth control/protection: Surgical    Comment: hyst  Other Topics Concern   Not on file  Social History Narrative   Not on file   Social Determinants of Health   Financial Resource Strain: Not on file  Food Insecurity: Not on file  Transportation Needs: Not on file  Physical Activity: Not on file  Stress: Not on file  Social Connections: Not on file     Family History:  The patient's family history includes Breast cancer (age of onset: 2) in her maternal aunt; Breast cancer (age of onset: 57) in her mother; Hypertension in her brother.  ROS:   Review of Systems  Constitutional:  Positive for malaise/fatigue. Negative for chills, diaphoresis, fever and weight loss.  HENT:  Negative for congestion.   Eyes:  Negative for discharge and redness.  Respiratory:  Negative for cough, sputum  production, shortness of breath and wheezing.   Cardiovascular:  Positive for chest pain. Negative for palpitations, orthopnea, claudication, leg swelling and PND.  Gastrointestinal:  Negative for abdominal pain, heartburn, nausea and vomiting.  Musculoskeletal:  Negative for falls and myalgias.  Skin:  Negative for rash.  Neurological:  Negative for dizziness, tingling, tremors, sensory change, speech change, focal weakness, loss of consciousness and weakness.  Endo/Heme/Allergies:  Does not bruise/bleed easily.  Psychiatric/Behavioral:  Negative for substance abuse. The patient has insomnia. The patient is not nervous/anxious.   All other systems reviewed and are negative.   EKGs/Labs/Other Studies Reviewed:    Studies reviewed were summarized above. The additional studies were reviewed today:  2D echo 01/2021: 1. Left ventricular ejection fraction, by estimation, is 60 to 65%. The  left ventricle has normal function. The left ventricle has no regional  wall motion abnormalities. Left ventricular diastolic parameters are  consistent with Grade II diastolic  dysfunction (pseudonormalization). The average left  ventricular global  longitudinal strain is -16.2 %.   2. Right ventricular systolic function is normal. The right ventricular  size is normal. There is mildly elevated pulmonary artery systolic  pressure. The estimated right ventricular systolic pressure is 06.2 mmHg.   3. The mitral valve is normal in structure.   4. Moderate to Severe mitral valve regurgitation (possibly severe). No  evidence of mitral stenosis. Possible mild prolapse of the posterior  leaflet.   5. The aortic valve is normal in structure. Aortic valve regurgitation is  mmild to moderate, possibly moderate. No aortic stenosis is present.   6. aortic root, measuring 36 mm. ascending aorta, measuring 36 mm.   7. The inferior vena cava is normal in size with greater than 50%  respiratory variability, suggesting  right atrial pressure of 3 mmHg.   8. Tricuspid valve regurgitation is moderate to severe.   Comparison(s): LVEF 55-60%, Mild-mod MR, Mild-Mod AI.  ___________   2D echo 01/2020: 1. Left ventricular ejection fraction, by estimation, is 55 to 60%. The  left ventricle has normal function. The left ventricle has no regional  wall motion abnormalities. There is mild left ventricular hypertrophy.  Left ventricular diastolic parameters  are consistent with Grade II diastolic dysfunction (pseudonormalization).   2. Right ventricular systolic function is normal. The right ventricular  size is normal. There is normal pulmonary artery systolic pressure.   3. Left atrial size was moderately dilated.   4. The mitral valve is degenerative. Mild to moderate mitral valve  regurgitation.   5. The aortic valve is tricuspid. Aortic valve regurgitation is mild to  moderate.   6. The inferior vena cava is normal in size with greater than 50%  respiratory variability, suggesting right atrial pressure of 3 mmHg. __________   [facility] Reach patch 11/2019: The patient was monitored for 14 days. The predominant rhythm was sinus with an average rate of 79 bpm (range 54-130 bpm in sinus). There were rare PAC's and PVC's. 26 atrial runs lasting up to 14.1 seconds with a maximum rate of 160 bpm occurred. No sustained arrhythmia or prolonged pause was observed. Patient triggered event corresponds to sinus rhythm with PAC's.   Predominantly sinus rhythm with rare PAC's and PVC's.  Multiple runs of PSVT noted, lasting up to 14 seconds. __________   ETT 11/2019: Baseline EKG demonstrates normal sinus rhythm without significant abnormality. The patient demonstrates decreased exercise capacity with hypertensive blood pressure response. No angina was reported. There were no diagnostic ST segment or T wave changes during stress or recovery. Isolated PVC was observed during recovery. There were no significant  arrhythmias. Intermediate risk exercise tolerance test (Duke Treadmill Score = 3).   Intermediate risk exercise tolerance test due to limited functional capacity.  No ischemic EKG changes noted at workload achieved.   EKG:  EKG is ordered today.  The EKG ordered today demonstrates NSR, 66 bpm, first-degree AV block, LVH, no acute ST-T changes  Recent Labs: 06/24/2020: ALT 14; Hemoglobin 12.9; Platelets 198 03/16/2021: BUN 17; Creatinine, Ser 0.70; Potassium 4.6; Sodium 140  Recent Lipid Panel No results found for: CHOL, TRIG, HDL, CHOLHDL, VLDL, LDLCALC, LDLDIRECT  PHYSICAL EXAM:    VS:  BP (!) 144/72 (BP Location: Left Arm, Patient Position: Sitting, Cuff Size: Normal)    Pulse 74    Ht 5\' 3"  (1.6 m)    Wt 172 lb (78 kg)    SpO2 98%    BMI 30.47 kg/m   BMI: Body mass index is 30.47  kg/m.  Physical Exam Vitals reviewed.  Constitutional:      Appearance: She is well-developed.  HENT:     Head: Normocephalic and atraumatic.  Eyes:     General:        Right eye: No discharge.        Left eye: No discharge.  Neck:     Vascular: No JVD.  Cardiovascular:     Rate and Rhythm: Normal rate and regular rhythm.     Pulses:          Posterior tibial pulses are 2+ on the right side and 2+ on the left side.     Heart sounds: S1 normal and S2 normal. Heart sounds not distant. No midsystolic click and no opening snap. Murmur heard.  High-pitched blowing holosystolic murmur is present with a grade of 1/6 at the apex.    No friction rub.  Pulmonary:     Effort: Pulmonary effort is normal. No respiratory distress.     Breath sounds: Normal breath sounds. No decreased breath sounds, wheezing or rales.  Chest:     Chest wall: No tenderness.  Abdominal:     General: There is no distension.     Palpations: Abdomen is soft.     Tenderness: There is no abdominal tenderness.  Musculoskeletal:     Cervical back: Normal range of motion.     Right lower leg: No edema.     Left lower leg: No  edema.  Skin:    General: Skin is warm and dry.     Nails: There is no clubbing.  Neurological:     Mental Status: She is alert and oriented to person, place, and time.  Psychiatric:        Speech: Speech normal.        Behavior: Behavior normal.        Thought Content: Thought content normal.        Judgment: Judgment normal.    Wt Readings from Last 3 Encounters:  04/23/21 172 lb (78 kg)  03/01/21 169 lb (76.7 kg)  12/30/20 170 lb (77.1 kg)     ASSESSMENT & PLAN:   Chest pain with moderate risk for cardiac etiology: Currently chest pain-free.  Schedule Lexiscan MPI to evaluate for high risk ischemia.  She remains on ASA 81 mg.  Recent echo as outlined above.  Valvular heart disease: Most recent echo from 01/2021 demonstrated progression of her valvular heart disease as outlined above.  She continues to decline TEE following informed consent discussion.  Given this, we will initiate losartan 12.5 mg daily with a follow-up BMP 1 week after initiating therapy.  I will have her follow-up with her primary cardiologist at her next visit to further discuss TEE.  Paroxysmal SVT: Quiescent.  Continue Cardizem CD 120 mg daily with short acting diltiazem 30 mg as needed for sustained tachypalpitations.  HTN: Blood pressure is mildly elevated in the office today.  Add losartan 12.5 mg as outlined above.  Otherwise, she remains on Cardizem CD 120 mg.  Low-sodium diet is encouraged.  OSA: Remains on CPAP.    Disposition: F/u with Dr. Saunders Revel or an APP in 2 months.   Medication Adjustments/Labs and Tests Ordered: Current medicines are reviewed at length with the patient today.  Concerns regarding medicines are outlined above. Medication changes, Labs and Tests ordered today are summarized above and listed in the Patient Instructions accessible in Encounters.   Signed, Christell Faith, PA-C 04/23/2021 1:11 PM  [place],   [id number] [phone]

## 2021-04-23 ENCOUNTER — Ambulatory Visit: Payer: Medicare PPO | Admitting: Physician Assistant

## 2021-04-23 ENCOUNTER — Other Ambulatory Visit: Payer: Self-pay

## 2021-04-23 ENCOUNTER — Encounter: Payer: Self-pay | Admitting: Physician Assistant

## 2021-04-23 VITALS — BP 144/72 | HR 74 | Ht 63.0 in | Wt 172.0 lb

## 2021-04-23 DIAGNOSIS — I10 Essential (primary) hypertension: Secondary | ICD-10-CM | POA: Diagnosis not present

## 2021-04-23 DIAGNOSIS — I471 Supraventricular tachycardia: Secondary | ICD-10-CM

## 2021-04-23 DIAGNOSIS — Z9989 Dependence on other enabling machines and devices: Secondary | ICD-10-CM

## 2021-04-23 DIAGNOSIS — I080 Rheumatic disorders of both mitral and aortic valves: Secondary | ICD-10-CM

## 2021-04-23 DIAGNOSIS — R079 Chest pain, unspecified: Secondary | ICD-10-CM | POA: Diagnosis not present

## 2021-04-23 DIAGNOSIS — G4733 Obstructive sleep apnea (adult) (pediatric): Secondary | ICD-10-CM

## 2021-04-23 MED ORDER — LOSARTAN POTASSIUM 25 MG PO TABS: 12.5000 mg | ORAL_TABLET | Freq: Every day | ORAL | 3 refills | Status: DC

## 2021-04-23 NOTE — Patient Instructions (Signed)
Medication Instructions:  Your physician has recommended you make the following change in your medication:   START Losartan 25 mg and take one half tablet (12.5 mg) once daily   *If you need a refill on your cardiac medications before your next appointment, please call your pharmacy*   Lab Work: BMET in 1 week  If you have labs (blood work) drawn today and your tests are completely normal, you will receive your results only by: [facility] (if you have MyChart) OR A paper copy in the mail If you have any lab test that is abnormal or we need to change your treatment, we will call you to review the results.   Testing/Procedures: [facility]  Your caregiver has ordered a Stress Test with nuclear imaging. The purpose of this test is to evaluate the blood supply to your heart muscle. This procedure is referred to as a "Non-Invasive Stress Test." This is because other than having an IV started in your vein, nothing is inserted or "invades" your body. Cardiac stress tests are done to find areas of poor blood flow to the heart by determining the extent of coronary artery disease (CAD). Some patients exercise on a treadmill, which naturally increases the blood flow to your heart, while others who are  unable to walk on a treadmill due to physical limitations have a pharmacologic/chemical stress agent called Lexiscan . This medicine will mimic walking on a treadmill by temporarily increasing your coronary blood flow.   Please note: these test may take anywhere between 2-4 hours to complete  PLEASE REPORT TO [facility] AT THE FIRST DESK WILL DIRECT YOU WHERE TO GO  Date of Procedure:________________________ Arrival Time for Procedure:___________________________  Instructions regarding medication:   _XX___ : Hold diabetes medication morning of procedure  PLEASE NOTIFY THE OFFICE AT LEAST 24 HOURS IN ADVANCE IF YOU ARE UNABLE TO KEEP YOUR APPOINTMENT.   [phone] AND  PLEASE NOTIFY NUCLEAR MEDICINE AT [facility] AT LEAST 24 HOURS IN ADVANCE IF YOU ARE UNABLE TO KEEP YOUR APPOINTMENT. [phone]  How to prepare for your Myoview test:  Do not eat or drink after midnight No caffeine for 24 hours prior to test No smoking 24 hours prior to test. Your medication may be taken with water.  If your doctor stopped a medication because of this test, do not take that medication. Ladies, please do not wear dresses.  Skirts or pants are appropriate. Please wear a short sleeve shirt. No perfume, cologne or lotion. Wear comfortable walking shoes. No heels!   Follow-Up: At [facility], you and your health needs are our priority.  As part of our continuing mission to provide you with exceptional heart care, we have created designated Provider Care Teams.  These Care Teams include your primary Cardiologist (physician) and Advanced Practice Providers (APPs -  Physician Assistants and Nurse Practitioners) who all work together to provide you with the care you need, when you need it.   Your next appointment:   2 month(s)  The format for your next appointment:   In Person  Provider:   [name], MD

## 2021-04-30 ENCOUNTER — Other Ambulatory Visit (INDEPENDENT_AMBULATORY_CARE_PROVIDER_SITE_OTHER): Payer: Medicare PPO

## 2021-04-30 ENCOUNTER — Other Ambulatory Visit: Payer: Self-pay

## 2021-04-30 DIAGNOSIS — R079 Chest pain, unspecified: Secondary | ICD-10-CM | POA: Diagnosis not present

## 2021-05-01 LAB — BASIC METABOLIC PANEL
BUN/Creatinine Ratio: 23 (ref 12–28)
BUN: 17 mg/dL (ref 8–27)
CO2: 25 mmol/L (ref 20–29)
Calcium: 9.3 mg/dL (ref 8.7–10.3)
Chloride: 103 mmol/L (ref 96–106)
Creatinine, Ser: 0.75 mg/dL (ref 0.57–1.00)
Glucose: 93 mg/dL (ref 70–99)
Potassium: 4.3 mmol/L (ref 3.5–5.2)
Sodium: 142 mmol/L (ref 134–144)
eGFR: 81 mL/min/{1.73_m2} (ref >59)

## 2021-05-03 ENCOUNTER — Telehealth: Payer: Self-pay | Admitting: *Deleted

## 2021-05-03 NOTE — Telephone Encounter (Signed)
Results reviewed with patient and she verbalized understanding with no further questions at this time.  °

## 2021-05-03 NOTE — Telephone Encounter (Signed)
Called and someone ask that I call back. Then attempted to reach patient via mobile number. No answer but left voicemail message to call back.

## 2021-05-03 NOTE — Telephone Encounter (Signed)
-----   Message from Rise Mu, PA-C sent at 05/01/2021  7:21 AM EST ----- Labs are normal on recently added losartan.  No changes from plan discussed at recent office visit.

## 2021-05-06 ENCOUNTER — Other Ambulatory Visit: Payer: Medicare PPO

## 2021-05-11 ENCOUNTER — Encounter
Admission: RE | Admit: 2021-05-11 | Discharge: 2021-05-11 | Disposition: A | Discharge status: Home / Self Care | Payer: Medicare PPO | Source: Ambulatory Visit | Attending: Physician Assistant | Admitting: Physician Assistant

## 2021-05-11 ENCOUNTER — Other Ambulatory Visit: Payer: Self-pay

## 2021-05-11 DIAGNOSIS — R079 Chest pain, unspecified: Secondary | ICD-10-CM | POA: Diagnosis present

## 2021-05-11 LAB — NM MYOCAR MULTI W/SPECT W/WALL MOTION / EF
LV dias vol: 99 mL (ref 46–106)
LV sys vol: 31 mL
Nuc Stress EF: 69 %
Peak HR: 91 {beats}/min
Percent HR: 64 %
Rest HR: 65 {beats}/min
Rest Nuclear Isotope Dose: 10.5 mCi
SDS: 3
SRS: 5
SSS: 6
ST Depression (mm): 0 mm
Stress Nuclear Isotope Dose: 33 mCi
TID: 0.86

## 2021-05-11 MED ORDER — TECHNETIUM TC 99M TETROFOSMIN IV KIT
30.0000 | PACK | Freq: Once | INTRAVENOUS | Status: AC | PRN
  Administered 2021-05-11: 33 via INTRAVENOUS

## 2021-05-11 MED ORDER — TECHNETIUM TC 99M TETROFOSMIN IV KIT
10.0000 | PACK | Freq: Once | INTRAVENOUS | Status: AC | PRN
  Administered 2021-05-11: 10.5 via INTRAVENOUS

## 2021-05-11 MED ORDER — REGADENOSON 0.4 MG/5ML IV SOLN
0.4000 mg | Freq: Once | INTRAVENOUS | Status: AC
  Administered 2021-05-11: 0.4 mg via INTRAVENOUS

## 2021-05-12 ENCOUNTER — Telehealth: Payer: Self-pay | Admitting: *Deleted

## 2021-05-12 NOTE — Telephone Encounter (Signed)
-----   Message from Rise Mu, PA-C sent at 05/12/2021  7:12 AM EST ----- Stress test showed no evidence of ischemia or scar with a normal pump function. No significant coronary artery calcification was noted. Some plaque buildup within the aorta was noted. Low risk study.

## 2021-05-12 NOTE — Telephone Encounter (Signed)
Left voicemail message to call back for review of results.  

## 2021-05-12 NOTE — Telephone Encounter (Signed)
Results reviewed with patient and she verbalized understanding with no further questions at this time.  °

## 2021-06-25 ENCOUNTER — Other Ambulatory Visit: Payer: Self-pay | Admitting: Obstetrics and Gynecology

## 2021-06-25 DIAGNOSIS — Z13820 Encounter for screening for osteoporosis: Secondary | ICD-10-CM

## 2021-07-01 ENCOUNTER — Ambulatory Visit: Payer: Medicare PPO | Admitting: Physician Assistant

## 2021-07-14 ENCOUNTER — Encounter: Payer: Self-pay | Admitting: Internal Medicine

## 2021-07-14 ENCOUNTER — Ambulatory Visit: Payer: Medicare PPO | Admitting: Internal Medicine

## 2021-07-14 VITALS — BP 150/70 | HR 80 | Ht 63.0 in | Wt 156.0 lb

## 2021-07-14 DIAGNOSIS — I471 Supraventricular tachycardia: Secondary | ICD-10-CM | POA: Diagnosis not present

## 2021-07-14 DIAGNOSIS — I080 Rheumatic disorders of both mitral and aortic valves: Secondary | ICD-10-CM | POA: Diagnosis not present

## 2021-07-14 DIAGNOSIS — I10 Essential (primary) hypertension: Secondary | ICD-10-CM

## 2021-07-14 DIAGNOSIS — R079 Chest pain, unspecified: Secondary | ICD-10-CM | POA: Diagnosis not present

## 2021-07-14 MED ORDER — DILTIAZEM HCL ER COATED BEADS 120 MG PO CP24
120.0000 mg | ORAL_CAPSULE | Freq: Every day | ORAL | 0 refills | Status: DC
Start: 2021-07-14 — End: 2021-10-08

## 2021-07-14 MED ORDER — LOSARTAN POTASSIUM 25 MG PO TABS: 12.5000 mg | ORAL_TABLET | Freq: Every day | ORAL | 0 refills | Status: DC

## 2021-07-14 NOTE — Progress Notes (Signed)
? ?Follow-up Outpatient Visit ?Date: 07/14/2021 ? ?Primary Care Provider: ?Azucena Freed, MD ?[address] ?[place] [id number] ? ?Chief Complaint: Follow-up PSVT and valvular heart disease ? ?HPI:  Holly Harrington is a 76 y.o. female with history of  PSVT, valvular heart disease (mild-moderate mitral and aortic regurgitation by echo in 01/2020), obstructive sleep apnea, hypertension, asthma, and osteoporosis, who presents for follow-up of PSVT, valvular heart disease, and chest pain.  She was last seen in our office in January by Christell Faith, PA, at which time she reported episodic chest pain lasting 2 to 3 days at a time.  She again declined TEE for further evaluation of her moderate-severe mitral regurgitation.  Pharmacologic myocardial perfusion stress test was recommended; study was low risk without evidence of ischemia or scar.  No significant coronary artery calcification was identified, though aortic atherosclerosis was noted. ? ?Today, Holly Harrington reports that she has been feeling fairly well though she reports some headaches and lightheadedness over the last 1 to 2 weeks.  During this time, she has only been taking her standing diltiazem intermittently as her supply was almost out.  She has been without this medication for 3 days.  She has also forgotten to take losartan for the last 2 weeks.  Furthermore, Holly Harrington reports that her CPAP has not been working well for the last 2 weeks.  She denies chest pain, shortness of breath, and palpitations.  She notes occasional dizziness that is reminiscent of prior vertigo, albeit less severe. ? ?-------------------------------------------------------------------------------------------------- ? ?Past Medical History:  ?Diagnosis Date  ? Asthma   ? Breast lump   ? Corneal ulcer   ? Fibroid   ? Hypertension   ? Obstructive sleep apnea   ? Osteopenia   ? Osteoporosis   ? PSVT (paroxysmal supraventricular tachycardia) ([place])   ? Valvular heart disease  01/2020  ? Mild-moderate mitral and aortic regurgitation  ? ?Past Surgical History:  ?Procedure Laterality Date  ? ANTERIOR AND POSTERIOR VAGINAL REPAIR    ? VAGINAL HYSTERECTOMY    ? ? ?Current Meds  ?Medication Sig  ? albuterol (VENTOLIN HFA) 108 (90 Base) MCG/ACT inhaler Inhale into the lungs every 6 (six) hours as needed for wheezing or shortness of breath.  ? aspirin EC 81 MG tablet Take 1 tablet (81 mg total) by mouth daily. Swallow whole.  ? diltiazem (CARDIZEM CD) 120 MG 24 hr capsule Take 1 capsule (120 mg total) by mouth daily.  ? diltiazem (CARDIZEM) 30 MG tablet Take 1 tablet (30 mg total) by mouth 4 (four) times daily as needed (racing heart rate or palpitations).  ? fluticasone (FLONASE) 50 MCG/ACT nasal spray Place 1 spray into both nostrils daily as needed for allergies or rhinitis.  ? fluticasone (FLOVENT HFA) 110 MCG/ACT inhaler Inhale into the lungs as needed.  ? losartan (COZAAR) 25 MG tablet Take 0.5 tablets (12.5 mg total) by mouth daily.  ? risedronate (ACTONEL) 150 MG tablet Take 1 tablet by mouth every 30 (thirty) days.  ? Vibegron (GEMTESA) 75 MG TABS Take 75 mg by mouth daily.  ? ? ?Allergies: Codeine, Hydrocodone, and Oxycodone ? ?Social History  ? ?Tobacco Use  ? Smoking status: Never  ? Smokeless tobacco: Never  ?Vaping Use  ? Vaping Use: Never used  ?Substance Use Topics  ? Alcohol use: Yes  ?  Comment: O'Doul's occasionally  ? Drug use: No  ? ? ?Family History  ?Problem Relation Age of Onset  ? Breast cancer Mother 74  ?  Hypertension Brother   ? Breast cancer Maternal Aunt 36  ? ? ?Review of Systems: ?A 12-system review of systems was performed and was negative except as noted in the HPI. ? ?-------------------------------------------------------------------------------------------------- ? ?Physical Exam: ?BP (!) 150/70 (BP Location: Right Arm, Patient Position: Sitting, Cuff Size: Large)   Pulse 80   Ht '5\' 3"'$  (1.6 m)   Wt 156 lb (70.8 kg)   SpO2 99%   BMI 27.63 kg/m?   ? ?General:  NAD. ?Neck: No JVD or HJR. ?Lungs: Clear to auscultation bilaterally without wheezes or crackles. ?Heart: Regular rate and rhythm with 1/6 systolic murmur loudest at the apex.  No rubs or gallops. ?Abdomen: Soft, nontender, nondistended. ?Extremities: No lower extremity edema. ? ?EKG: Normal sinus rhythm with borderline LVH.  PR interval has shortened compared to prior tracing on 04/23/2021.  Otherwise, no significant interval change. ? ?Lab Results  ?Component Value Date  ? WBC 7.6 06/24/2020  ? HGB 12.9 06/24/2020  ? HCT 40.2 06/24/2020  ? MCV 94.1 06/24/2020  ? PLT 198 06/24/2020  ? ? ?Lab Results  ?Component Value Date  ? NA 142 04/30/2021  ? K 4.3 04/30/2021  ? CL 103 04/30/2021  ? CO2 25 04/30/2021  ? BUN 17 04/30/2021  ? CREATININE 0.75 04/30/2021  ? GLUCOSE 93 04/30/2021  ? ALT 14 06/24/2020  ? ? ?No results found for: CHOL, HDL, LDLCALC, LDLDIRECT, TRIG, CHOLHDL ? ?-------------------------------------------------------------------------------------------------- ? ?ASSESSMENT AND PLAN: ?Valvular heart disease with mitral and aortic insufficiency: ?Please [place] reports minimal symptoms and appears euvolemic on exam today.  She has previously declined TEE for further assessment of her moderate to severe mitral regurgitation.  We have agreed to defer this, as she is planning to travel to [place] for several weeks at the [place] of this month.  We will readdress PE versus follow-up TTE at her next visit in 3 months.  Continue blood pressure control. ? ?Hypertension: ?Blood pressure mildly elevated today, likely due to this [place] having been off diltiazem and losartan.  We will refill both prescriptions today. ? ?Chest pain: ?No further pain reported.  Recent myocardial perfusion stress test was low risk without ischemia or scar.  No further work-up planned at this time. ? ?PSVT: ?No significant palpitations reported.  We will have Holly Harrington restart diltiazem 120 mg daily.  She also can  continue to use as needed diltiazem for breakthrough symptoms. ? ?Follow-up: Return to clinic in 3 months. ? ?[name], MD ?07/14/2021 ?10:47 AM ? ?

## 2021-07-14 NOTE — Patient Instructions (Signed)
Medication Instructions:  ? ?Your physician recommends that you continue on your current medications as directed. Please refer to the Current Medication list given to you today. ? ?Refills have been sent today for your Diltiazem 120 mg and Losartan ? ?*If you need a refill on your cardiac medications before your next appointment, please call your pharmacy* ? ? ?Lab Work: ? ?None ordered ? ?Testing/Procedures: ? ?None ordered ? ? ?Follow-Up: ?At [facility], you and your health needs are our priority.  As part of our continuing mission to provide you with exceptional heart care, we have created designated Provider Care Teams.  These Care Teams include your primary Cardiologist (physician) and Advanced Practice Providers (APPs -  Physician Assistants and Nurse Practitioners) who all work together to provide you with the care you need, when you need it. ? ?We recommend signing up for the patient portal called "MyChart".  Sign up information is provided on this After Visit Summary.  MyChart is used to connect with patients for Virtual Visits (Telemedicine).  Patients are able to view lab/test results, encounter notes, upcoming appointments, etc.  Non-urgent messages can be sent to your provider as well.   ?To learn more about what you can do with MyChart, go to [email or web address].   ? ?Your next appointment:   ?3 month(s) ? ?The format for your next appointment:   ?In Person ? ?Provider:   ?You may see [name], MD or one of the following Advanced Practice Providers on your designated Care Team:   ?Murray Hodgkins, NP ?Christell Faith, PA-C ?Cadence Kathlen Mody, PA-C ?

## 2021-07-19 ENCOUNTER — Encounter: Payer: Self-pay | Admitting: Internal Medicine

## 2021-07-19 NOTE — Addendum Note (Signed)
Addended by: Raelene Bott, Tashianna Broome L on: 07/19/2021 09:47 AM ? ? Modules accepted: Orders ? ?

## 2021-10-08 ENCOUNTER — Other Ambulatory Visit: Payer: Self-pay | Admitting: Internal Medicine

## 2021-10-14 LAB — COLOGUARD: COLOGUARD: NEGATIVE

## 2021-10-15 ENCOUNTER — Ambulatory Visit: Payer: Medicare PPO | Admitting: Internal Medicine

## 2021-10-15 ENCOUNTER — Encounter: Payer: Self-pay | Admitting: Internal Medicine

## 2021-10-15 ENCOUNTER — Other Ambulatory Visit
Admission: RE | Admit: 2021-10-15 | Discharge: 2021-10-15 | Disposition: A | Discharge status: Home / Self Care | Payer: Medicare PPO | Attending: Internal Medicine | Admitting: Internal Medicine

## 2021-10-15 VITALS — BP 136/56 | HR 75 | Ht 61.0 in | Wt 160.0 lb

## 2021-10-15 DIAGNOSIS — M791 Myalgia, unspecified site: Secondary | ICD-10-CM | POA: Insufficient documentation

## 2021-10-15 DIAGNOSIS — I471 Supraventricular tachycardia: Secondary | ICD-10-CM | POA: Diagnosis not present

## 2021-10-15 DIAGNOSIS — I10 Essential (primary) hypertension: Secondary | ICD-10-CM | POA: Diagnosis not present

## 2021-10-15 DIAGNOSIS — I080 Rheumatic disorders of both mitral and aortic valves: Secondary | ICD-10-CM | POA: Diagnosis not present

## 2021-10-15 LAB — SEDIMENTATION RATE: Sed Rate: 18 mm/hr (ref 0–30)

## 2021-10-15 NOTE — Patient Instructions (Signed)
Medication Instructions:   Your physician recommends that you continue on your current medications as directed. Please refer to the Current Medication list given to you today.  *If you need a refill on your cardiac medications before your next appointment, please call your pharmacy*   Lab Work:  Today at the medical mall at [facility]: Sedimentation rate  -  Please go to the [place] [facility] [place] in at the Registration Desk: 1st desk to the right, past the screening table  If you have labs (blood work) drawn today and your tests are completely normal, you will receive your results only by: MyChart Message (if you have MyChart) OR A paper copy in the mail If you have any lab test that is abnormal or we need to change your treatment, we will call you to review the results.   Testing/Procedures:  Your physician has requested that you have an echocardiogram in 3 MONTHS. Echocardiography is a painless test that uses sound waves to create images of your heart. It provides your doctor with information about the size and shape of your heart and how well your heart's chambers and valves are working. This procedure takes approximately one hour. There are no restrictions for this procedure.   Follow-Up: At [facility], you and your health needs are our priority.  As part of our continuing mission to provide you with exceptional heart care, we have created designated Provider Care Teams.  These Care Teams include your primary Cardiologist (physician) and Advanced Practice Providers (APPs -  Physician Assistants and Nurse Practitioners) who all work together to provide you with the care you need, when you need it.  We recommend signing up for the patient portal called "MyChart".  Sign up information is provided on this After Visit Summary.  MyChart is used to connect with patients for Virtual Visits (Telemedicine).  Patients are able to view lab/test results, encounter notes, upcoming  appointments, etc.  Non-urgent messages can be sent to your provider as well.   To learn more about what you can do with MyChart, go to [email or web address].    Your next appointment:    3 month(s) (shortly AFTER echo)  The format for your next appointment:   In Person  Provider:   [name], MD{   Important Information About Sugar

## 2021-10-15 NOTE — Progress Notes (Signed)
Follow-up Outpatient Visit Date: 10/15/2021  Primary Care Provider: Garnette Czech, MD [address] [place] [address]  Chief Complaint: Follow-up SVT and valvular heart disease  HPI:  Holly Harrington is a 76 y.o. 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  I last saw her in April, at which time she complained of lightheadedness and headaches for 1-2 weeks.  She had only been using diltiazem intermittently during this time, as she was about to run out.  Her CPAP was also not working well during this time.  She declined moving forward with TEE for further evaluation of her valvular heart disease.  Today, Holly Harrington reports that she feels fairly well.  She still notes some fatigue since she returned from her trip to [place] in early June.  She had mild exertional dyspnea when walking extended distances there, which seem to be well controlled with her as needed inhaler.  She has not had any frank chest pain or palpitations though she sometimes experiences a "weird" feeling in her chest when she lies down at night.  She will often take a baby aspirin with prompt relief.  She has not had any edema.  She has occasional transient orthostatic lightheadedness if she gets up too quickly at night.  She is using CPAP regularly again.  Holly Harrington other complaint today is of pain and soreness in her shoulders and thighs.  She has been experiencing this for a few weeks.  She denies headaches but notes that her vision has changed quite a bit over the last year, requiring multiple prescription changes.  She was evaluated for some inflammatory condition by rheumatologist in [place] and at [facility] in the past and reports briefly being on methotrexate one point several years  ago.  --------------------------------------------------------------------------------------------------  Past Medical History:  Diagnosis Date   Asthma    Breast lump    Corneal ulcer    Fibroid    Hypertension    Obstructive sleep apnea    Osteopenia    Osteoporosis    PSVT (paroxysmal supraventricular tachycardia) (HCC)    Valvular heart disease 01/2020   Mild-moderate mitral and aortic regurgitation   Past Surgical History:  Procedure Laterality Date   ANTERIOR AND POSTERIOR VAGINAL REPAIR     VAGINAL HYSTERECTOMY       Recent CV Pertinent Labs: Lab Results  Component Value Date   K 4.3 04/30/2021   MG 2.3 03/19/2020   BUN 17 04/30/2021   CREATININE 0.75 04/30/2021    Past medical and surgical history were reviewed and updated in EPIC.  Current Meds  Medication Sig   albuterol (VENTOLIN HFA) 108 (90 Base) MCG/ACT inhaler Inhale into the lungs every 6 (six) hours as needed for wheezing or shortness of breath.   aspirin EC 81 MG tablet Take 1 tablet (81 mg total) by mouth daily. Swallow whole.   diltiazem (CARDIZEM CD) 120 MG 24 hr capsule TAKE ONE CAPSULE BY MOUTH DAILY   diltiazem (CARDIZEM) 30 MG tablet Take 1 tablet (30 mg total) by mouth 4 (four) times daily as needed (racing heart rate or palpitations).   fluticasone (FLONASE) 50 MCG/ACT nasal spray Place 1 spray into both nostrils daily as needed for allergies or rhinitis.   fluticasone (FLOVENT HFA) 110 MCG/ACT inhaler Inhale into the lungs as needed.   losartan (COZAAR) 25 MG tablet TAKE 1/2 TABLET  BY MOUTH DAILY   Vibegron (GEMTESA) 75 MG TABS Take 75 mg by mouth daily.    Allergies: Codeine, Hydrocodone, and Oxycodone  Social History   Tobacco Use   Smoking status: Never   Smokeless tobacco: Never  Vaping Use   Vaping Use: Never used  Substance Use Topics   Alcohol use: Yes    Comment: O'Doul's occasionally   Drug use: No    Family History  Problem Relation Age of Onset   Breast cancer  Mother 55   Hypertension Brother    Stroke Brother    Hypertension Brother    Breast cancer Maternal Aunt 36    Review of Systems: A 12-system review of systems was performed and was negative except as noted in the HPI.  --------------------------------------------------------------------------------------------------  Physical Exam: BP (!) 136/56 (BP Location: Left Arm, Patient Position: Sitting, Cuff Size: Large)   Pulse 75   Ht $R'5\' 1"'LJ$  (1.549 m)   Wt 160 lb (72.6 kg)   SpO2 98%   BMI 30.23 kg/m   General:  NAD. Neck: No JVD or HJR. Lungs: Clear to auscultation bilaterally without wheezes or crackles. Heart: Regular rate and rhythm without murmurs, rubs, or gallops. Abdomen: Soft, nontender, nondistended. Extremities: No lower extremity edema.  EKG: Normal sinus rhythm with borderline LVH.  No significant abnormalities or changes from prior tracing on 07/14/2021.  Lab Results  Component Value Date   WBC 7.6 06/24/2020   HGB 12.9 06/24/2020   HCT 40.2 06/24/2020   MCV 94.1 06/24/2020   PLT 198 06/24/2020    Lab Results  Component Value Date   NA 142 04/30/2021   K 4.3 04/30/2021   CL 103 04/30/2021   CO2 25 04/30/2021   BUN 17 04/30/2021   CREATININE 0.75 04/30/2021   GLUCOSE 93 04/30/2021   ALT 14 06/24/2020    --------------------------------------------------------------------------------------------------  ASSESSMENT AND PLAN: Valvular heart disease with mitral and aortic regurgitation: Other than some fatigue, Holly Harrington has minimal symptoms.  She appears euvolemic on exam.  She remains reluctant to move forward with TEE to further evaluate moderate to severe MR noted on most recent echo.  We have agreed to repeat a transthoracic echocardiogram shortly before her follow-up visit with me in 3 months.  As long as she does not have any evidence of worsening MR or LV dilation/weakness, I think it would be reasonable to continue with close clinical follow-up.  If  she becomes more symptomatic or echo findings are worse, we will need to consider TEE and R/LHC and referral to valve specialist.  Hypertension: Blood pressure upper normal today.  We will continue current doses of diltiazem and losartan.  PSVT: Palpitations improved with more regular use of diltiazem.  No further intervention/work-up today.  Myalgias: Holly Harrington reports pain/soreness in her shoulders and thighs.  We will check an ESR today.  If it is elevated, she will need to speak with her PCP and rheumatologist for further evaluation, including assessment for PMR.  Follow-up: Return to clinic in 3 months with echocardiogram shortly before the visit.  [name], MD 10/15/2021 11:39 AM

## 2021-10-18 ENCOUNTER — Encounter: Payer: Self-pay | Admitting: Internal Medicine

## 2021-11-03 ENCOUNTER — Other Ambulatory Visit: Payer: Self-pay | Admitting: *Deleted

## 2021-11-03 ENCOUNTER — Encounter: Payer: Self-pay | Admitting: Internal Medicine

## 2021-11-03 DIAGNOSIS — Z79899 Other long term (current) drug therapy: Secondary | ICD-10-CM

## 2021-11-03 DIAGNOSIS — I10 Essential (primary) hypertension: Secondary | ICD-10-CM

## 2021-11-03 MED ORDER — LOSARTAN POTASSIUM 25 MG PO TABS: 25.0000 mg | ORAL_TABLET | Freq: Every day | ORAL | 1 refills | Status: DC

## 2021-11-03 NOTE — Telephone Encounter (Signed)
I recommend that Holly Harrington stop diltiazem (she has already been holding it for a while) and increase losartan to 25 mg daily.  We should recheck a BMP in ~2 weeks.  [name], MD [facility]

## 2022-01-11 ENCOUNTER — Other Ambulatory Visit: Payer: Self-pay | Admitting: Internal Medicine

## 2022-01-18 ENCOUNTER — Ambulatory Visit: Payer: Medicare PPO | Attending: Internal Medicine

## 2022-01-18 DIAGNOSIS — I080 Rheumatic disorders of both mitral and aortic valves: Secondary | ICD-10-CM

## 2022-01-18 LAB — ECHOCARDIOGRAM COMPLETE
AR max vel: 2.23 cm2
AV Area VTI: 2.48 cm2
AV Area mean vel: 2.47 cm2
AV Mean grad: 3 mmHg
AV Peak grad: 5.7 mmHg
Ao pk vel: 1.19 m/s
Area-P 1/2: 2.29 cm2
Calc EF: 56.8 %
P 1/2 time: 449 msec
S' Lateral: 3 cm
Single Plane A2C EF: 54.1 %
Single Plane A4C EF: 57.8 %

## 2022-01-20 ENCOUNTER — Ambulatory Visit: Payer: Medicare PPO | Admitting: Internal Medicine

## 2022-01-20 ENCOUNTER — Other Ambulatory Visit
Admission: RE | Admit: 2022-01-20 | Discharge: 2022-01-20 | Disposition: A | Discharge status: Home / Self Care | Payer: Medicare PPO | Source: Ambulatory Visit | Attending: Internal Medicine | Admitting: Internal Medicine

## 2022-01-20 ENCOUNTER — Encounter: Payer: Self-pay | Admitting: Internal Medicine

## 2022-01-20 ENCOUNTER — Ambulatory Visit: Payer: Medicare PPO | Attending: Internal Medicine | Admitting: Internal Medicine

## 2022-01-20 VITALS — BP 150/66 | HR 66 | Ht 62.0 in | Wt 164.0 lb

## 2022-01-20 DIAGNOSIS — H6990 Unspecified Eustachian tube disorder, unspecified ear: Secondary | ICD-10-CM | POA: Diagnosis not present

## 2022-01-20 DIAGNOSIS — I471 Supraventricular tachycardia, unspecified: Secondary | ICD-10-CM | POA: Diagnosis present

## 2022-01-20 DIAGNOSIS — R079 Chest pain, unspecified: Secondary | ICD-10-CM | POA: Insufficient documentation

## 2022-01-20 LAB — BASIC METABOLIC PANEL
Anion gap: 7 (ref 5–15)
BUN: 24 mg/dL — ABNORMAL HIGH (ref 8–23)
CO2: 27 mmol/L (ref 22–32)
Calcium: 9.3 mg/dL (ref 8.9–10.3)
Chloride: 105 mmol/L (ref 98–111)
Creatinine, Ser: 0.72 mg/dL (ref 0.44–1.00)
GFR, Estimated: >60 mL/min (ref >60)
Glucose, Bld: 99 mg/dL (ref 70–99)
Potassium: 4.1 mmol/L (ref 3.5–5.1)
Sodium: 139 mmol/L (ref 135–145)

## 2022-01-20 MED ORDER — LOSARTAN POTASSIUM 50 MG PO TABS: 50.0000 mg | ORAL_TABLET | Freq: Every day | ORAL | 3 refills | Status: DC

## 2022-01-20 NOTE — Progress Notes (Signed)
Follow-up Outpatient Visit Date: 01/20/2022  Primary Care Provider: Azucena Freed, MD [place] [address]  Chief Complaint: Follow-up mitral regurgitation and PSVT  HPI:  Ms. Gallen is a 76 y.o. 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  I last saw her in July, at which time she was feeling fairly well, though she reported some fatigue since returning from a trip to [place] in June.  She also complained of myalgias prompting [place] to check a sedimentation rate to exclude PMR.  We again discussed TEE for further evaluation of her mitral valve disease, which Ms. Prentiss declined.  Repeat TTE earlier this week showed normal LVEF with moderate mitral regurgitation.  Today, Ms. Panchal reports that she feels about the same as our prior visits.  She still has quite a bit of fatigue, though it improved after stopping extended release diltiazem.  She is now taking her short acting diltiazem nightly at bedtime and feels like this helps to regulate her palpitations fairly well.  She still notes occasional palpitations at night.  She has not had any palpitations.  She has mild exertional dyspnea that has been stable.  She reports 1 episode of "wooziness" but has otherwise not had any significant lightheadedness or dizziness.  She sporadically checks her blood pressure at home and notes that it is usually around 765 mmHg systolic.  --------------------------------------------------------------------------------------------------  Past Medical History:  Diagnosis Date   Asthma    Breast lump    Corneal ulcer    Fibroid    Hypertension    Obstructive sleep apnea    Osteopenia    Osteoporosis    PSVT (paroxysmal supraventricular tachycardia)    Valvular heart disease 01/2020   Mild-moderate mitral and  aortic regurgitation   Past Surgical History:  Procedure Laterality Date   ANTERIOR AND POSTERIOR VAGINAL REPAIR     VAGINAL HYSTERECTOMY      Current Meds  Medication Sig   albuterol (VENTOLIN HFA) 108 (90 Base) MCG/ACT inhaler Inhale into the lungs every 6 (six) hours as needed for wheezing or shortness of breath.   aspirin EC 81 MG tablet Take 1 tablet (81 mg total) by mouth daily. Swallow whole.   b complex vitamins capsule Take 1 capsule by mouth daily.   Cholecalciferol (VITAMIN D-3 PO) Take by mouth daily.   clobetasol ointment (TEMOVATE) 4.65 % Apply 1 Application topically as needed.   diltiazem (CARDIZEM) 30 MG tablet Take 1 tablet (30 mg total) by mouth 4 (four) times daily as needed (racing heart rate or palpitations).   fluticasone (FLONASE) 50 MCG/ACT nasal spray Place 1 spray into both nostrils daily as needed for allergies or rhinitis.   fluticasone (FLOVENT HFA) 110 MCG/ACT inhaler Inhale into the lungs as needed.   Homeopathic Products (LEG CRAMPS PO) Take by mouth at bedtime.   losartan (COZAAR) 25 MG tablet Take 1 tablet (25 mg total) by mouth daily.   Thiamine HCl (VITAMIN B1 PO) Take by mouth daily.   Vibegron (GEMTESA) 75 MG TABS Take 75 mg by mouth daily.    Allergies: Codeine, Hydrocodone, and Oxycodone  Social History   Tobacco Use   Smoking status: Never   Smokeless tobacco: Never  Vaping Use   Vaping Use: Never used  Substance Use Topics   Alcohol use: Yes    Comment: O'Doul's occasionally   Drug  use: No    Family History  Problem Relation Age of Onset   Breast cancer Mother 60   Hypertension Brother    Stroke Brother    Hypertension Brother    Breast cancer Maternal Aunt 36    Review of Systems: A 12-system review of systems was performed and was negative except as noted in the HPI.  --------------------------------------------------------------------------------------------------  Physical Exam: BP (!) 150/66 (BP Location: Left Arm,  Patient Position: Sitting, Cuff Size: Normal)   Pulse 66   Ht '5\' 2"'$  (1.575 m)   Wt 164 lb (74.4 kg)   SpO2 96%   BMI 30.00 kg/m   General:  NAD. Neck: No JVD or HJR. Lungs: Clear to auscultation bilaterally without wheezes or crackles. Heart: Regular rate and rhythm with 2/6 systolic murmur.  No rubs or gallops. Abdomen: Soft, nontender, nondistended. Extremities: No lower extremity edema.  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 Doig has stable NYHA class II-3 symptoms.  While her fatigue could be due to a number of factors, I remain concerned that her mitral regurgitation is playing a role.  Echocardiogram earlier this week again showed moderate MR, though it may be underestimated.  She has previously declined transesophageal echocardiogram and cardiac catheterization.  She is somewhat amenable to TEE at this time but is concerned about potential risks associated with esophageal intubation.  Specifically she notes a history of ear and eustachian tube problems.  She does not have a known history of a Zenker's diverticulum nor has she ever had any esophageal instrumentation.  She denies dysphagia as well.  I have advised her that the risk of significant pharyngeal/esophageal injury during TEE is rare (approximately 1 in 10,000).  However, she would like further evaluation before proceeding with TEE.  As she has been seen by [facility] in the  past, I will refer her back for further evaluation of her reported eustachian tube problems as well as to exclude pharyngeal issues that could complicate TEE.  As she does not have any dysphagia or other risk factors for esophageal stricture, I do not think that GI consultation is indicated at this point.  Due to suboptimal blood pressure control, we will increase losartan to 50 mg daily for afterload reduction.  I will check a BMP today and again in about 2 weeks to ensure stable renal function and potassium.  PSVT: Palpitations have been relatively minimal off standing diltiazem.  Ms. Domek can continue to use her as needed short acting diltiazem at night and when breakthrough palpitations occur.  Hypertension: Blood pressure suboptimally controlled today.  We will increase losartan to 50 mg daily with BMP today and again in about 2 weeks.  Follow-up: Return to clinic in 2 months.  [name], MD 01/20/2022 9:59 AM

## 2022-01-20 NOTE — Patient Instructions (Signed)
Medication Instructions:   INCREASE Losartan - take one tablet  (50 mg) by mouth daily.   *If you need a refill on your cardiac medications before your next appointment, please call your pharmacy*   Lab Work:  Your Physician recommends you have lab work completed TODAY at the medical mall.  Your physician recommends you have lab work in [place] at the medical mall.   If you have labs (blood work) drawn today and your tests are completely normal, you will receive your results only by: [place] (if you have MyChart) OR A paper copy in the mail If you have any lab test that is abnormal or we need to change your treatment, we will call you to review the results.   Testing/Procedures:  None Ordered   Follow-Up: At [facility], you and your health needs are our priority.  As part of our continuing mission to provide you with exceptional heart care, we have created designated Provider Care Teams.  These Care Teams include your primary Cardiologist (physician) and Advanced Practice Providers (APPs -  Physician Assistants and Nurse Practitioners) who all work together to provide you with the care you need, when you need it.  We recommend signing up for the patient portal called "MyChart".  Sign up information is provided on this After Visit Summary.  MyChart is used to connect with patients for Virtual Visits (Telemedicine).  Patients are able to view lab/test results, encounter notes, upcoming appointments, etc.  Non-urgent messages can be sent to your provider as well.   To learn more about what you can do with MyChart, go to [email or web address].    Your next appointment:   2 month(s)  The format for your next appointment:   In Person  Provider:   You may see [name], MD or one of the following Advanced Practice Providers on your designated Care Team:   Murray Hodgkins, NP Christell Faith, PA-C Cadence Kathlen Mody, PA-C Gerrie Nordmann, NP

## 2022-01-21 ENCOUNTER — Encounter: Payer: Self-pay | Admitting: Internal Medicine

## 2022-01-25 ENCOUNTER — Telehealth: Payer: Self-pay

## 2022-01-25 DIAGNOSIS — I10 Essential (primary) hypertension: Secondary | ICD-10-CM

## 2022-01-25 NOTE — Telephone Encounter (Signed)
The patient has been notified of the result and verbalized understanding.  All questions (if any) were answered. Kavin Leech, RN 01/25/2022 2:39 PM

## 2022-01-25 NOTE — Telephone Encounter (Signed)
-----   Message from [name], MD sent at 01/24/2022  7:28 AM EDT ----- Kidney function and electrolytes normal.  Ok to increase losartan to 50 mg daily, as discussed at last week's office visit with repeat BMP in ~2 weeks.

## 2022-02-09 ENCOUNTER — Other Ambulatory Visit
Admission: RE | Admit: 2022-02-09 | Discharge: 2022-02-09 | Disposition: A | Discharge status: Home / Self Care | Payer: Medicare PPO | Attending: Internal Medicine | Admitting: Internal Medicine

## 2022-02-09 DIAGNOSIS — I10 Essential (primary) hypertension: Secondary | ICD-10-CM | POA: Insufficient documentation

## 2022-02-09 LAB — BASIC METABOLIC PANEL
Anion gap: 5 (ref 5–15)
BUN: 18 mg/dL (ref 8–23)
CO2: 27 mmol/L (ref 22–32)
Calcium: 9.4 mg/dL (ref 8.9–10.3)
Chloride: 108 mmol/L (ref 98–111)
Creatinine, Ser: 0.6 mg/dL (ref 0.44–1.00)
GFR, Estimated: >60 mL/min (ref >60)
Glucose, Bld: 101 mg/dL — ABNORMAL HIGH (ref 70–99)
Potassium: 4.2 mmol/L (ref 3.5–5.1)
Sodium: 140 mmol/L (ref 135–145)

## 2022-03-17 ENCOUNTER — Other Ambulatory Visit: Payer: Self-pay | Admitting: Obstetrics and Gynecology

## 2022-03-17 DIAGNOSIS — M810 Age-related osteoporosis without current pathological fracture: Secondary | ICD-10-CM

## 2022-03-17 DIAGNOSIS — Z13820 Encounter for screening for osteoporosis: Secondary | ICD-10-CM

## 2022-03-22 ENCOUNTER — Ambulatory Visit
Admission: RE | Admit: 2022-03-22 | Discharge: 2022-03-22 | Disposition: A | Discharge status: Home / Self Care | Payer: Medicare PPO | Source: Ambulatory Visit | Attending: Obstetrics and Gynecology | Admitting: Obstetrics and Gynecology

## 2022-03-22 DIAGNOSIS — Z13820 Encounter for screening for osteoporosis: Secondary | ICD-10-CM

## 2022-03-22 DIAGNOSIS — M810 Age-related osteoporosis without current pathological fracture: Secondary | ICD-10-CM

## 2022-03-24 ENCOUNTER — Encounter: Payer: Self-pay | Admitting: Internal Medicine

## 2022-03-24 ENCOUNTER — Ambulatory Visit: Payer: Medicare PPO | Attending: Internal Medicine | Admitting: Internal Medicine

## 2022-03-24 VITALS — BP 120/64 | HR 80 | Ht 62.0 in | Wt 166.0 lb

## 2022-03-24 DIAGNOSIS — I471 Supraventricular tachycardia, unspecified: Secondary | ICD-10-CM | POA: Diagnosis not present

## 2022-03-24 DIAGNOSIS — I080 Rheumatic disorders of both mitral and aortic valves: Secondary | ICD-10-CM

## 2022-03-24 DIAGNOSIS — H6990 Unspecified Eustachian tube disorder, unspecified ear: Secondary | ICD-10-CM | POA: Diagnosis not present

## 2022-03-24 DIAGNOSIS — I10 Essential (primary) hypertension: Secondary | ICD-10-CM

## 2022-03-24 NOTE — Patient Instructions (Signed)
Medication Instructions:  Your Physician recommend you continue on your current medication as directed.    *If you need a refill on your cardiac medications before your next appointment, please call your pharmacy*   Lab Work: None ordered today   Testing/Procedures: None ordered    Follow-Up: At [facility], you and your health needs are our priority.  As part of our continuing mission to provide you with exceptional heart care, we have created designated Provider Care Teams.  These Care Teams include your primary Cardiologist (physician) and Advanced Practice Providers (APPs -  Physician Assistants and Nurse Practitioners) who all work together to provide you with the care you need, when you need it.  We recommend signing up for the patient portal called "MyChart".  Sign up information is provided on this After Visit Summary.  MyChart is used to connect with patients for Virtual Visits (Telemedicine).  Patients are able to view lab/test results, encounter notes, upcoming appointments, etc.  Non-urgent messages can be sent to your provider as well.   To learn more about what you can do with MyChart, go to [email or web address].    Your next appointment:   6 month(s)  The format for your next appointment:   In Person  Provider:   You may see [name], MD or one of the following Advanced Practice Providers on your designated Care Team:   Murray Hodgkins, NP Christell Faith, PA-C Cadence Kathlen Mody, PA-C Gerrie Nordmann, NP

## 2022-03-24 NOTE — Progress Notes (Signed)
Follow-up Outpatient Visit Date: 03/24/2022  Primary Care Provider: Azucena Freed, MD [place] [id number]  Chief Complaint: Follow-up SVT and valvular heart disease  HPI:  Holly Harrington is a 76 y.o. 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  I last saw her in October, at which time she continued to feel fatigued, though it had improved with discontinuation of extended release diltiazem.  She was taking short acting diltiazem nightly at bedtime to help regulate her palpitations.  Mild exertional dyspnea was unchanged from prior visits.  We remained concerned that mitral regurgitation was contributing to her dyspnea.  I encouraged Holly Harrington to move forward with TEE, though she was concerned about potential for complications related to eustachian tube dysfunction.  She was referred to ENT for further evaluation but has not been contacted about an appointment yet.  Today, Holly Harrington reports that she has been dealing with a sinus and respiratory tract infection for the last 3 weeks.  She is due to finish her antibiotics tomorrow.  She has also been using an inhaler and was told that if she continues to wheeze today, she may need a course of steroids.  She is hoping to avoid this.  She has not had any chest pain nor shortness of breath.  She notes only rare palpitations when going to bed at times.  She felt a little bit lightheaded this morning but otherwise has not had any lightheadedness or dizziness.  She has not needed to use her as needed diltiazem.  She is tolerating losartan well.  She has not had any leg edema.  --------------------------------------------------------------------------------------------------  Past Medical History:  Diagnosis Date   Asthma    Breast lump    Corneal ulcer     Fibroid    Hypertension    Obstructive sleep apnea    Osteopenia    Osteoporosis    PSVT (paroxysmal supraventricular tachycardia)    Valvular heart disease 01/2020   Moderate-severe mitral regurgitation; mild aortic regurgitation   Past Surgical History:  Procedure Laterality Date   ANTERIOR AND POSTERIOR VAGINAL REPAIR     VAGINAL HYSTERECTOMY      Current Meds  Medication Sig   albuterol (VENTOLIN HFA) 108 (90 Base) MCG/ACT inhaler Inhale into the lungs every 6 (six) hours as needed for wheezing or shortness of breath.   amoxicillin-clavulanate (AUGMENTIN) 875-125 MG tablet Take 1 tablet by mouth 2 (two) times daily.   aspirin EC 81 MG tablet Take 1 tablet (81 mg total) by mouth daily. Swallow whole.   b complex vitamins capsule Take 1 capsule by mouth daily.   Cholecalciferol (VITAMIN D-3 PO) Take by mouth daily.   clobetasol ointment (TEMOVATE) 2.75 % Apply 1 Application topically as needed.   dextromethorphan-guaiFENesin (MUCINEX DM) 30-600 MG 12hr tablet Take 1 tablet by mouth 2 (two) times daily.   diltiazem (CARDIZEM) 30 MG tablet Take 1 tablet (30 mg total) by mouth 4 (four) times daily as needed (racing heart rate or palpitations).   fluticasone (FLONASE) 50 MCG/ACT nasal spray Place 1 spray into both nostrils daily as needed for allergies or rhinitis.   fluticasone (FLOVENT HFA) 110 MCG/ACT inhaler Inhale into the lungs as needed.   Homeopathic Products (LEG CRAMPS PO) Take by mouth at bedtime.   loratadine-pseudoephedrine (CLARITIN-D 12-HOUR) 5-120 MG tablet Take 1 tablet by mouth 2 (two) times  daily.   losartan (COZAAR) 50 MG tablet Take 1 tablet (50 mg total) by mouth daily.   naproxen sodium (ALEVE) 220 MG tablet Take 220 mg by mouth as needed.   Niacin (VITAMIN B-3 PO) Take by mouth daily.   Thiamine HCl (VITAMIN B1 PO) Take by mouth daily.   Vibegron (GEMTESA) 75 MG TABS Take 75 mg by mouth daily.    Allergies: Codeine, Hydrocodone, and Oxycodone  Social  History   Tobacco Use   Smoking status: Never   Smokeless tobacco: Never  Vaping Use   Vaping Use: Never used  Substance Use Topics   Alcohol use: Yes    Comment: O'Doul's occasionally   Drug use: No    Family History  Problem Relation Age of Onset   Breast cancer Mother 65   Hypertension Brother    Stroke Brother    Hypertension Brother    Breast cancer Maternal Aunt 36    Review of Systems: A 12-system review of systems was performed and was negative except as noted in the HPI.  --------------------------------------------------------------------------------------------------  Physical Exam: BP 120/64 (BP Location: Left Arm, Patient Position: Sitting, Cuff Size: Normal)   Pulse 80   Ht '5\' 2"'$  (1.575 m)   Wt 166 lb (75.3 kg)   SpO2 97%   BMI 30.36 kg/m   General:  NAD. Neck: No JVD or HJR. Lungs: Clear to auscultation bilaterally without wheezes or crackles. Heart: Regular rate and rhythm with occasional extrasystoles and 1/6 systolic murmur.  No rubs or gallops. Abdomen: Soft, nontender, nondistended. Extremities: No lower extremity edema.  EKG: Sinus rhythm with PAC and LVH.  PAC is new since 10/15/2021.  Otherwise, no significant interval change.  Lab Results  Component Value Date   WBC 7.6 06/24/2020   HGB 12.9 06/24/2020   HCT 40.2 06/24/2020   MCV 94.1 06/24/2020   PLT 198 06/24/2020    Lab Results  Component Value Date   NA 140 02/09/2022   K 4.2 02/09/2022   CL 108 02/09/2022   CO2 27 02/09/2022   BUN 18 02/09/2022   CREATININE 0.60 02/09/2022   GLUCOSE 101 (H) 02/09/2022   ALT 14 06/24/2020    No results found for: "CHOL", "HDL", "LDLCALC", "LDLDIRECT", "TRIG", "CHOLHDL"  --------------------------------------------------------------------------------------------------  ASSESSMENT AND PLAN: Valvular heart disease: Holly Harrington has history of moderate-severe mitral regurgitation as well as mild aortic insufficiency.  She continues to be  largely asymptomatic.  She has been very reluctant to proceed with TEE in the past and remains concerned about risks particularly regarding her history of eustachian tube dysfunction.  We will reach out to [facility] to help facilitate consultation.  Given that her most recent echo showed only moderate MR, we will defer moving forward with TEE at this time and plan to repeat an echocardiogram in 01/2023.  If she develops symptoms in the meantime or has evidence of worsening MR, we will need to move forward with TEE and right/left heart catheterization.  Eustachian tube dysfunction: This has been a longstanding concern for Holly Harrington.  She was evaluated by [facility] in the remote past; we will work on getting her in to see them again at her earliest convenience.  Hypertension: Blood pressure well-controlled today.  No medication changes at this time.  PSVT: Holly Harrington reports only rare palpitations, usually when lying down at night.  She has not needed any as needed diltiazem.  She previously did not tolerate standing diltiazem well due to associated fatigue.  Will  defer medication changes at this time.  Follow-up: Return to clinic in 6 months.  [name], MD 03/24/2022 2:51 PM

## 2022-03-25 ENCOUNTER — Encounter: Payer: Self-pay | Admitting: Internal Medicine

## 2022-03-25 DIAGNOSIS — H6990 Unspecified Eustachian tube disorder, unspecified ear: Secondary | ICD-10-CM | POA: Insufficient documentation

## 2022-06-10 ENCOUNTER — Emergency Department
Admission: EM | Admit: 2022-06-10 | Discharge: 2022-06-10 | Disposition: A | Discharge status: Home / Self Care | Payer: Medicare PPO | Attending: Emergency Medicine | Admitting: Emergency Medicine

## 2022-06-10 ENCOUNTER — Other Ambulatory Visit: Payer: Self-pay

## 2022-06-10 ENCOUNTER — Encounter: Payer: Self-pay | Admitting: Radiology

## 2022-06-10 DIAGNOSIS — M79602 Pain in left arm: Secondary | ICD-10-CM | POA: Diagnosis present

## 2022-06-10 LAB — COMPREHENSIVE METABOLIC PANEL
ALT: 11 U/L (ref 0–44)
AST: 19 U/L (ref 15–41)
Albumin: 4.4 g/dL (ref 3.5–5.0)
Alkaline Phosphatase: 66 U/L (ref 38–126)
Anion gap: 12 (ref 5–15)
BUN: 23 mg/dL (ref 8–23)
CO2: 25 mmol/L (ref 22–32)
Calcium: 9.5 mg/dL (ref 8.9–10.3)
Chloride: 101 mmol/L (ref 98–111)
Creatinine, Ser: 0.8 mg/dL (ref 0.44–1.00)
GFR, Estimated: >60 mL/min (ref >60)
Glucose, Bld: 128 mg/dL — ABNORMAL HIGH (ref 70–99)
Potassium: 4.8 mmol/L (ref 3.5–5.1)
Sodium: 138 mmol/L (ref 135–145)
Total Bilirubin: 0.7 mg/dL (ref 0.3–1.2)
Total Protein: 8.5 g/dL — ABNORMAL HIGH (ref 6.5–8.1)

## 2022-06-10 LAB — CBC WITH DIFFERENTIAL/PLATELET
Abs Immature Granulocytes: 0.03 10*3/uL (ref 0.00–0.07)
Basophils Absolute: 0.1 10*3/uL (ref 0.0–0.1)
Basophils Relative: 1 %
Eosinophils Absolute: 0.7 10*3/uL — ABNORMAL HIGH (ref 0.0–0.5)
Eosinophils Relative: 8 %
HCT: 40.2 % (ref 36.0–46.0)
Hemoglobin: 12.7 g/dL (ref 12.0–15.0)
Immature Granulocytes: 0 %
Lymphocytes Relative: 32 %
Lymphs Abs: 2.5 10*3/uL (ref 0.7–4.0)
MCH: 29.8 pg (ref 26.0–34.0)
MCHC: 31.6 g/dL (ref 30.0–36.0)
MCV: 94.4 fL (ref 80.0–100.0)
Monocytes Absolute: 0.4 10*3/uL (ref 0.1–1.0)
Monocytes Relative: 5 %
Neutro Abs: 4.2 10*3/uL (ref 1.7–7.7)
Neutrophils Relative %: 54 %
Platelets: 181 10*3/uL (ref 150–400)
RBC: 4.26 MIL/uL (ref 3.87–5.11)
RDW: 13.2 % (ref 11.5–15.5)
WBC: 7.8 10*3/uL (ref 4.0–10.5)
nRBC: 0 % (ref 0.0–0.2)

## 2022-06-10 LAB — TROPONIN I (HIGH SENSITIVITY): Troponin I (High Sensitivity): 6 ng/L (ref <18)

## 2022-06-10 MED ORDER — PREDNISONE 10 MG (21) PO TBPK: ORAL_TABLET | ORAL | 0 refills | Status: DC

## 2022-06-10 MED ORDER — GABAPENTIN 100 MG PO CAPS: 100.0000 mg | ORAL_CAPSULE | Freq: Three times a day (TID) | ORAL | 0 refills | Status: DC | PRN

## 2022-06-10 NOTE — ED Provider Notes (Signed)
[facility] Provider Note    Event Date/Time   First MD Initiated Contact with Patient 06/10/22 1722     (approximate)   History   Left arm pain   HPI  Holly Harrington is a 77 y.o. female who presents to the emergency department today because of concerns for left arm pain.  The patient states that the pain started four days ago.  He goes from her shoulder down to her wrist.  Today it was first around her elbow.  It is a sharp pain.  She denies any trauma.  Does states she was moving some boxes around when she was cleaning out a closet but does not recall hurting that arm. The patient denies similar pain in the past but states she did have issues with her rotator cuffs last year. Patient denies any weakness or numbness in her arm. Denies any recent illness/shortness of breath.     Physical Exam   Triage Vital Signs: ED Triage Vitals  Enc Vitals Group     BP 06/10/22 1642 (!) 173/79     Pulse Rate 06/10/22 1642 77     Resp 06/10/22 1642 17     Temp 06/10/22 1642 97.6 F (36.4 C)     Temp Source 06/10/22 1642 Oral     SpO2 06/10/22 1642 98 %     Weight 06/10/22 1641 170 lb (77.1 kg)     Height 06/10/22 1641 '5\' 2"'$  (1.575 m)     Head Circumference --      Peak Flow --      Pain Score 06/10/22 1722 1     Pain Loc --      Pain Edu? --      Excl. in [place]? --     Most recent vital signs: Vitals:   06/10/22 1642 06/10/22 1800  BP: (!) 173/79   Pulse: 77 77  Resp: 17 17  Temp: 97.6 F (36.4 C)   SpO2: 98% 100%   General: Awake, alert, oriented. CV:  Good peripheral perfusion. Regular rate and rhythm. Resp:  Normal effort. Lungs clear. Abd:  No distention.  Other:  Left arm without edema or deformity. Radial pulse 2+. Strength 5/5. Sensation intact over all finger pads.    ED Results / Procedures / Treatments   Labs (all labs ordered are listed, but only abnormal results are displayed) Labs Reviewed  CBC WITH DIFFERENTIAL/PLATELET -  Abnormal; Notable for the following components:      Result Value   Eosinophils Absolute 0.7 (*)    All other components within normal limits  COMPREHENSIVE METABOLIC PANEL - Abnormal; Notable for the following components:   Glucose, Bld 128 (*)    Total Protein 8.5 (*)    All other components within normal limits  TROPONIN I (HIGH SENSITIVITY)  TROPONIN I (HIGH SENSITIVITY)     EKG  I, Nance Pear, attending physician, personally viewed and interpreted this EKG  EKG Time: 1651 Rate: 77 Rhythm: normal sinus rhythm Axis: normal Intervals: qtc 423 QRS: LVH ST changes: no st elevation Impression: abnormal ekg   RADIOLOGY None   PROCEDURES:  Critical Care performed: No  Procedures    MEDICATIONS ORDERED IN ED: Medications - No data to display   IMPRESSION / MDM / [place] / ED COURSE  I reviewed the triage vital signs and the nursing notes.  Differential diagnosis includes, but is not limited to, cervical radiculopathy, DVT, arterial blood clot.  Patient's presentation is most consistent with acute presentation with potential threat to life or bodily function.  The patient is on the cardiac monitor to evaluate for evidence of arrhythmia and/or significant heart rate changes.  Patient presented to the emergency department today because of concerns for left arm pain.  On exam patient's left arm within normal limits.  Neurovascularly intact.  Blood work without concerning troponin elevation.  At this time I think cervical radiculopathy is likely.  I discussed this with the patient.  Will plan on discharging with steroids and pain medication.  Discussed return precautions.      FINAL CLINICAL IMPRESSION(S) / ED DIAGNOSES   Final diagnoses:  Left arm pain      Note:  This document was prepared using Dragon voice recognition software and may include unintentional dictation errors.    Nance Pear, MD 06/10/22  Drema Halon

## 2022-06-10 NOTE — ED Triage Notes (Signed)
Pt states her left arm has been hurting since Monday. States the pain started in the back of her upper arm then into her elbow now its aching in her forearm. Pt states she has had problems with both her arms in the past. Pt states she had some numbness in the arm on Monday but none since that first day. Pt states it only hurts now. Pt states she started taking Advil and Aleve for the pain which she states hasn't helped much.

## 2022-06-10 NOTE — Discharge Instructions (Signed)
Please seek medical attention for any high fevers, chest pain, shortness of breath, change in behavior, persistent vomiting, bloody stool or any other new or concerning symptoms.  

## 2022-07-13 ENCOUNTER — Other Ambulatory Visit: Payer: Self-pay | Admitting: Internal Medicine

## 2022-10-03 ENCOUNTER — Ambulatory Visit: Payer: Medicare PPO | Attending: Internal Medicine | Admitting: Internal Medicine

## 2022-10-03 ENCOUNTER — Encounter: Payer: Self-pay | Admitting: Internal Medicine

## 2022-10-03 ENCOUNTER — Other Ambulatory Visit
Admission: RE | Admit: 2022-10-03 | Discharge: 2022-10-03 | Disposition: A | Discharge status: Home / Self Care | Payer: Medicare PPO | Source: Ambulatory Visit | Attending: Internal Medicine | Admitting: Internal Medicine

## 2022-10-03 VITALS — BP 128/58 | HR 77 | Ht 62.0 in | Wt 176.4 lb

## 2022-10-03 DIAGNOSIS — I38 Endocarditis, valve unspecified: Secondary | ICD-10-CM | POA: Diagnosis not present

## 2022-10-03 DIAGNOSIS — M79602 Pain in left arm: Secondary | ICD-10-CM

## 2022-10-03 DIAGNOSIS — Z79899 Other long term (current) drug therapy: Secondary | ICD-10-CM | POA: Diagnosis present

## 2022-10-03 DIAGNOSIS — I10 Essential (primary) hypertension: Secondary | ICD-10-CM

## 2022-10-03 DIAGNOSIS — I340 Nonrheumatic mitral (valve) insufficiency: Secondary | ICD-10-CM

## 2022-10-03 DIAGNOSIS — I471 Supraventricular tachycardia, unspecified: Secondary | ICD-10-CM | POA: Diagnosis not present

## 2022-10-03 LAB — COMPREHENSIVE METABOLIC PANEL
ALT: 10 U/L (ref 0–44)
AST: 16 U/L (ref 15–41)
Albumin: 4.1 g/dL (ref 3.5–5.0)
Alkaline Phosphatase: 69 U/L (ref 38–126)
Anion gap: 5 (ref 5–15)
BUN: 20 mg/dL (ref 8–23)
CO2: 28 mmol/L (ref 22–32)
Calcium: 9.3 mg/dL (ref 8.9–10.3)
Chloride: 104 mmol/L (ref 98–111)
Creatinine, Ser: 0.83 mg/dL (ref 0.44–1.00)
GFR, Estimated: >60 mL/min (ref >60)
Glucose, Bld: 91 mg/dL (ref 70–99)
Potassium: 4.2 mmol/L (ref 3.5–5.1)
Sodium: 137 mmol/L (ref 135–145)
Total Bilirubin: 0.7 mg/dL (ref 0.3–1.2)
Total Protein: 7.4 g/dL (ref 6.5–8.1)

## 2022-10-03 LAB — LIPID PANEL
Cholesterol: 170 mg/dL (ref 0–200)
HDL: 55 mg/dL (ref >40)
LDL Cholesterol: 98 mg/dL (ref 0–99)
Total CHOL/HDL Ratio: 3.1 RATIO
Triglycerides: 85 mg/dL (ref <150)
VLDL: 17 mg/dL (ref 0–40)

## 2022-10-03 NOTE — Progress Notes (Unsigned)
Follow-up Outpatient Visit Date: 10/03/2022  Primary Care Provider: Garnette Czech, MD [address] [place] [address]  Chief Complaint: Follow-up SVT and valvular heart disease  HPI:  Holly Harrington is a 77 y.o. 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  I last saw her in December, at which time she was dealing with a sinus and respiratory tract infection; this had been ongoing for about 3 weeks and was being treated with antibiotics.  She otherwise has been feeling well with only rare palpitations.  She had not needed any of her as needed diltiazem.  We again discussed proceeding with TEE, which she was reluctant to do on account of her sinus issues and eustachian tube dysfunction.  We again referred her to [facility] for further evaluation, though she never heard from them or reached out to the practice herself.  Today, Holly Harrington reports that she has been most bothered by pain and paresthesias in the left arm.  She has been on several courses of corticosteroids and NSAIDs with transient relief.  She is now using a combination of lidocaine patches and ibuprofen as well as duloxetine, which has been controlling her symptoms for the most part.  She is scheduled to see both physical therapy and a spine specialist at [facility].  She has also been bothered by pain in the left eye reminiscent of a corneal ulcer that she had in the past.  She has been taking ibuprofen for this as well and is scheduled to see her ophthalmologist next month.  From a heart standpoint, Holly Harrington has been feeling fairly well without chest pain, shortness of breath, palpitations, lightheadedness, or edema.  She has stable chronic 3 pillow orthopnea.  She has not needed to take her as needed diltiazem for any palpitations.  She notes  that her blood pressure had been elevated over the last month or 2, which prompted her to self try titrate losartan up to 75 mg daily (previously 50 mg daily.  --------------------------------------------------------------------------------------------------  Past Medical History:  Diagnosis Date   Asthma    Breast lump    Corneal ulcer    Fibroid    Hypertension    Obstructive sleep apnea    Osteopenia    Osteoporosis    PSVT (paroxysmal supraventricular tachycardia)    Valvular heart disease 01/2020   Moderate-severe mitral regurgitation; mild aortic regurgitation   Past Surgical History:  Procedure Laterality Date   ANTERIOR AND POSTERIOR VAGINAL REPAIR     VAGINAL HYSTERECTOMY      Current Meds  Medication Sig   albuterol (VENTOLIN HFA) 108 (90 Base) MCG/ACT inhaler Inhale into the lungs every 6 (six) hours as needed for wheezing or shortness of breath.   aspirin EC 81 MG tablet Take 1 tablet (81 mg total) by mouth daily. Swallow whole.   Cholecalciferol (VITAMIN D-3 PO) Take by mouth daily.   clobetasol ointment (TEMOVATE) 0.05 % Apply 1 Application topically as needed.   DULoxetine (CYMBALTA) 20 MG capsule Take 1 capsule by mouth daily.   fluticasone (FLOVENT HFA) 110 MCG/ACT inhaler Inhale into the lungs as needed.   Homeopathic Products (LEG CRAMPS PO) Take by mouth at bedtime.   losartan (COZAAR) 50 MG tablet Take 1 tablet (50 mg total) by mouth daily. (Patient taking differently: Take 75 mg by mouth daily.)   naproxen sodium (ALEVE)  220 MG tablet Take 220 mg by mouth as needed.   Thiamine HCl (VITAMIN B1 PO) Take by mouth daily.   Vibegron (GEMTESA) 75 MG TABS Take 75 mg by mouth daily.   [DISCONTINUED] Niacin (VITAMIN B-3 PO) Take by mouth daily.   [DISCONTINUED] predniSONE (STERAPRED UNI-PAK 21 TAB) 10 MG (21) TBPK tablet Per packaging instructions    Allergies: Hydrocodone, Oxycodone, and Codeine  Social History   Tobacco Use   Smoking status: Never    Smokeless tobacco: Never  Vaping Use   Vaping Use: Never used  Substance Use Topics   Alcohol use: Yes    Comment: O'Doul's occasionally   Drug use: No    Family History  Problem Relation Age of Onset   Breast cancer Mother 30   Hypertension Brother    Stroke Brother    Hypertension Brother    Breast cancer Maternal Aunt 36    Review of Systems: A 12-system review of systems was performed and was negative except as noted in the HPI.  --------------------------------------------------------------------------------------------------  Physical Exam: BP (!) 128/58 (BP Location: Left Arm, Patient Position: Sitting, Cuff Size: Large)   Pulse 77   Ht 5\' 2"  (1.575 m)   Wt 176 lb 6.4 oz (80 kg)   SpO2 97%   BMI 32.26 kg/m   General:  NAD. Neck: No JVD or HJR. Lungs: Clear to auscultation bilaterally without wheezes or crackles. Heart: Regular rate and rhythm with 1/6 systolic murmur.  No rubs or gallops. Abdomen: Soft, nontender, nondistended. Extremities: No lower extremity edema.  EKG: Normal sinus rhythm with first-degree AV block (PR interval 212 ms), LVH, and possible septal infarct.  Poor R wave progression in V1 and V2 is new from prior tracings on 06/10/2022 and 03/24/2022 and most likely reflect differences in lead placement.  Lab Results  Component Value Date   WBC 7.8 06/10/2022   HGB 12.7 06/10/2022   HCT 40.2 06/10/2022   MCV 94.4 06/10/2022   PLT 181 06/10/2022    Lab Results  Component Value Date   NA 138 06/10/2022   K 4.8 06/10/2022   CL 101 06/10/2022   CO2 25 06/10/2022   BUN 23 06/10/2022   CREATININE 0.80 06/10/2022   GLUCOSE 128 (H) 06/10/2022   ALT 11 06/10/2022    No results found for: "CHOL", "HDL", "LDLCALC", "LDLDIRECT", "TRIG", "CHOLHDL"  --------------------------------------------------------------------------------------------------  ASSESSMENT AND PLAN: Valvular heart disease: Holly Harrington appears euvolemic and is without  symptoms of heart failure despite her moderate to severe valvular heart disease.  She has previously declined TEE and wishes to defer again today given her other issues.  We will plan to repeat an echocardiogram shortly before her follow-up visit with [place] in 4 months from now.  I advised her to contact us if she develops dyspnea, edema, or other concerning symptoms.  Hypertension: Blood pressure has been running high at home over the last couple of months but seems better with self titration of losartan.  We will continue 75 mg daily.  I will check a CMP today given escalation of losartan to ensure stable renal function and electrolytes.  PSVT: No palpitations reported.  Continue as needed diltiazem should palpitations recur; no standing AV nodal blockers at this time with mild first-degree AV block noted today.  Left arm pain and paresthesias: Symptoms most consistent with neuropathic or musculoskeletal etiology.  Holly Harrington should continue her current medications though I encouraged her to limit her NSAID use if possible.  Further workup per  spine provider and physical therapy, with whom she will be meeting soon.  Symptoms not consistent with angina; we will check a lipid panel today for risk stratification.  Follow-up: Return to clinic in 4 months.   [name], MD 10/03/2022 4:24 PM

## 2022-10-03 NOTE — Patient Instructions (Signed)
Medication Instructions:  Your physician recommends that you continue on your current medications as directed. Please refer to the Current Medication list given to you today.  *If you need a refill on your cardiac medications before your next appointment, please call your pharmacy*   Lab Work: Your provider would like for you to have following labs drawn: (CMP, Lipid).   Please go to the [facility] entrance and check in at the front desk.  You do not need an appointment.  They are open from 7am-6 pm.     Testing/Procedures: Your physician has requested that you have an echocardiogram in October, 2024. Echocardiography is a painless test that uses sound waves to create images of your heart. It provides your doctor with information about the size and shape of your heart and how well your heart's chambers and valves are working.   You may receive an ultrasound enhancing agent through an IV if needed to better visualize your heart during the echo. This procedure takes approximately one hour.  There are no restrictions for this procedure.  This will take place at [address], [place] [address]    Follow-Up: At [facility], you and your health needs are our priority.  As part of our continuing mission to provide you with exceptional heart care, we have created designated Provider Care Teams.  These Care Teams include your primary Cardiologist (physician) and Advanced Practice Providers (APPs -  Physician Assistants and Nurse Practitioners) who all work together to provide you with the care you need, when you need it.  We recommend signing up for the patient portal called "MyChart".  Sign up information is provided on this After Visit Summary.  MyChart is used to connect with patients for Virtual Visits (Telemedicine).  Patients are able to view lab/test results, encounter notes, upcoming appointments, etc.  Non-urgent messages can be sent to  your provider as well.   To learn more about what you can do with MyChart, go to [email or web address].    Your next appointment:   4 month(s)  Provider:   You may see [name], MD or one of the following Advanced Practice Providers on your designated Care Team:   Nicolasa Ducking, NP Eula Listen, PA-C Cadence Fransico Michael, PA-C Charlsie Quest, NP

## 2022-10-04 ENCOUNTER — Encounter: Payer: Self-pay | Admitting: Internal Medicine

## 2022-10-04 DIAGNOSIS — M79602 Pain in left arm: Secondary | ICD-10-CM | POA: Insufficient documentation

## 2022-10-04 DIAGNOSIS — I38 Endocarditis, valve unspecified: Secondary | ICD-10-CM | POA: Insufficient documentation

## 2022-10-28 ENCOUNTER — Other Ambulatory Visit: Payer: Self-pay | Admitting: Internal Medicine

## 2022-11-02 ENCOUNTER — Telehealth: Payer: Self-pay | Admitting: Internal Medicine

## 2022-11-02 ENCOUNTER — Encounter: Payer: Self-pay | Admitting: Internal Medicine

## 2022-11-02 NOTE — Telephone Encounter (Signed)
Requested Prescriptions   Signed Prescriptions Disp Refills   losartan (COZAAR) 50 MG tablet 135 tablet 0    Sig: Take 1.5 tablets (75 mg total) by mouth daily.    Authorizing Provider: END, CHRISTOPHER    Ordering User: Thayer Headings, BRANDY L

## 2022-11-02 NOTE — Telephone Encounter (Signed)
*  STAT* If patient is at the pharmacy, call can be transferred to refill team.   1. Which medications need to be refilled? (please list name of each medication and dose if known) losartan (COZAAR) 50 MG tablet    2. Would you like to learn more about the convenience, safety, & potential cost savings by using the [facility]?  no   3. Are you open to using the [facility] (Type [facility]. ).no   4. Which pharmacy/location (including street and city if local pharmacy) is medication to be sent to? [facility] [phone] - [place],  - [address]    5. Do they need a 30 day or 90 day supply? 90 day  Take 75 mg by mouth daily.  Patient states she lost her medication bottle when she was in [place], and she needs a refill of it now as she is out of the medication.

## 2022-12-01 ENCOUNTER — Telehealth: Payer: Self-pay | Admitting: Internal Medicine

## 2022-12-01 DIAGNOSIS — H6990 Unspecified Eustachian tube disorder, unspecified ear: Secondary | ICD-10-CM

## 2022-12-01 NOTE — Telephone Encounter (Signed)
That is fine.  I believe this will be the third time we have sent in a referral.  I suggest that Ms. Andreason call [facility] herself to schedule an appointment as well.  [name], MD [facility]

## 2022-12-01 NOTE — Telephone Encounter (Signed)
Called patient and informed her of the following from Dr. Okey Dupre.  That is fine.  I believe this will be the third time we have sent in a referral.  I suggest that Holly Harrington call [facility] herself to schedule an appointment as well.   [name], MD [facility]  Patient verbalizes understanding. Referral placed for ENT.

## 2022-12-01 NOTE — Telephone Encounter (Signed)
New Message:      Patient would like for you to send another referral to [facility] please.

## 2022-12-08 NOTE — Telephone Encounter (Signed)
Pt stated the ENT office still hasn't received her referral and she'd unsure as to why but she'd like for a nurse to give their office a call about it. She'd also like to see if the referral can be changed to her seeing [employer] instead of [facility]. Pt is requesting a callback regarding these changes. Please advise

## 2022-12-08 NOTE — Addendum Note (Signed)
Addended by: Parke Poisson on: 12/08/2022 11:34 AM   Modules accepted: Orders

## 2022-12-08 NOTE — Telephone Encounter (Signed)
Nurse contacted [facility] and was informed they do not use Epic and unable to obtain internal referral. Referral faxed to their office at [phone].  Pt made aware

## 2023-01-19 ENCOUNTER — Emergency Department: Payer: Medicare PPO

## 2023-01-19 ENCOUNTER — Ambulatory Visit: Payer: Medicare PPO | Attending: Internal Medicine

## 2023-01-19 ENCOUNTER — Emergency Department
Admission: EM | Admit: 2023-01-19 | Discharge: 2023-01-20 | Disposition: A | Discharge status: Home / Self Care | Payer: Medicare PPO | Attending: Emergency Medicine | Admitting: Emergency Medicine

## 2023-01-19 ENCOUNTER — Other Ambulatory Visit: Payer: Self-pay

## 2023-01-19 ENCOUNTER — Encounter: Payer: Self-pay | Admitting: *Deleted

## 2023-01-19 DIAGNOSIS — S199XXA Unspecified injury of neck, initial encounter: Secondary | ICD-10-CM | POA: Diagnosis present

## 2023-01-19 DIAGNOSIS — W19XXXA Unspecified fall, initial encounter: Secondary | ICD-10-CM

## 2023-01-19 DIAGNOSIS — S161XXA Strain of muscle, fascia and tendon at neck level, initial encounter: Secondary | ICD-10-CM | POA: Insufficient documentation

## 2023-01-19 DIAGNOSIS — I38 Endocarditis, valve unspecified: Secondary | ICD-10-CM | POA: Diagnosis not present

## 2023-01-19 DIAGNOSIS — S0990XA Unspecified injury of head, initial encounter: Secondary | ICD-10-CM | POA: Insufficient documentation

## 2023-01-19 DIAGNOSIS — W010XXA Fall on same level from slipping, tripping and stumbling without subsequent striking against object, initial encounter: Secondary | ICD-10-CM | POA: Insufficient documentation

## 2023-01-19 LAB — ECHOCARDIOGRAM COMPLETE
Area-P 1/2: 4.15 cm2
S' Lateral: 2.6 cm

## 2023-01-19 NOTE — ED Notes (Signed)
C-collar placed in triage.  Blood pressure elevated.  Hx htn.  Pt due to take bp meds at 8pm per pt.

## 2023-01-19 NOTE — ED Triage Notes (Addendum)
Pt states she fell 2 nights ago and now has neck pain.  Pt hit the back of the head on the floor.  Pt stepped on a foot stool, the leg broke and pt fell backwards striking her head on the floor.  .   No blood thinners.  No loc  no n/v.  Pt alert   speech clear.

## 2023-01-20 MED ORDER — MUSCLE RUB 10-15 % EX CREA: 1.0000 | TOPICAL_CREAM | CUTANEOUS | 0 refills | Status: DC | PRN

## 2023-01-20 NOTE — Discharge Instructions (Addendum)
Take acetaminophen 650 mg and ibuprofen 400 mg every 6 hours for pain.  Take with food. Use muscle rub  Thank you for choosing [place] for your health care today!  Please see your primary doctor this week for a follow up appointment.   If you have any new, worsening, or unexpected symptoms call your doctor right away or come back to the emergency department for reevaluation.  It was my pleasure to care for you today.   Daneil Dan Modesto Charon, MD

## 2023-01-20 NOTE — ED Provider Notes (Signed)
[facility] Provider Note    Event Date/Time   First MD Initiated Contact with Patient 01/19/23 2356     (approximate)   History   Fall and Neck Injury   HPI  Holly Harrington is a 77 y.o. female   Past medical history of osteoporosis presents emergency department with a mechanical slip and fall 2 days ago with headache and neck soreness.  2 Nights ago stepped on a stool to get up in bed the leg of the stool broke and she fell backward striking the back of her head.    The time she felt well enough to forego medical evaluation but in the interim she developed neck soreness and mild headache.  She denies any new trauma.  She does not take blood thinners did not pass out did not have vomiting.  It was mechanical in nature she denies any presyncopal symptoms and otherwise been in her regular state of health with no other acute medical complaints.  She denies numbness or tingling anywhere in her body, no weakness, no incontinence.  No vision changes.  External Medical Documents Reviewed: Cardiology note from June 2024 for SVT and valvular heart disease      Physical Exam   Triage Vital Signs: ED Triage Vitals  Encounter Vitals Group     BP 01/19/23 1934 (!) 214/80     Systolic BP Percentile --      Diastolic BP Percentile --      Pulse Rate 01/19/23 1934 81     Resp 01/19/23 1934 17     Temp 01/19/23 1934 97.9 F (36.6 C)     Temp src --      SpO2 01/19/23 1934 99 %     Weight 01/19/23 1932 176 lb 5.9 oz (80 kg)     Height 01/19/23 1932 5\' 2"  (1.575 m)     Head Circumference --      Peak Flow --      Pain Score 01/19/23 1932 3     Pain Loc --      Pain Education --      Exclude from Growth Chart --     Most recent vital signs: Vitals:   01/20/23 0102 01/20/23 0123  BP: (!) 171/99 (!) 182/66  Pulse:  82  Resp:  15  Temp:  98 F (36.7 C)  SpO2:  99%    General: Awake, no distress.  CV:  Good peripheral perfusion.  Resp:  Normal  effort. Abd:  No distention.  Other:  Pleasant woman no acute distress, hypertensive in triage, improved on recheck in the setting of not taking her nightly medications.  She has no focal neurologic deficits including facial asymmetry, vision changes, motor or sensory deficits, she has a supple neck with full range of motion no midline tenderness of the C-spine.  She has no obvious trauma to the back of her head where she struck.   ED Results / Procedures / Treatments   Labs (all labs ordered are listed, but only abnormal results are displayed) Labs Reviewed - No data to display    RADIOLOGY I independently reviewed and interpreted CT of the head and see no obvious bleeding or midline shift I also reviewed radiologist's formal read.   PROCEDURES:  Critical Care performed: No  Procedures   MEDICATIONS ORDERED IN ED: Medications - No data to display  IMPRESSION / MDM / ASSESSMENT AND PLAN / ED COURSE  I reviewed the triage vital signs  and the nursing notes.                                Patient's presentation is most consistent with acute presentation with potential threat to life or bodily function.  Differential diagnosis includes, but is not limited to, blunt traumatic injury leading to ICH, skull fracture, C-spine fracture dislocation, ligamentous injury, spinal cord injury, dissection   The patient is on the cardiac monitor to evaluate for evidence of arrhythmia and/or significant heart rate changes.  MDM:    Patient with delayed onset neck soreness in the setting of fall and head strike injury 2 days ago.  Given her age head strike and headache, obtain CT of the head to rule out ICH which fortunately looks negative.  C-spine CT as well looks normal.  I considered dissection but I think less likely given the location of her soreness bilateral paraspinal delayed onset more likely cervical strain.  No neurologic symptoms rules against dissection as well.  Anticipatory  guidance follow-up with PMD.         FINAL CLINICAL IMPRESSION(S) / ED DIAGNOSES   Final diagnoses:  Fall, initial encounter  Cervical strain, acute, initial encounter  Traumatic injury of head, initial encounter     Rx / DC Orders   ED Discharge Orders          Ordered    Menthol-Methyl Salicylate (MUSCLE RUB) 10-15 % CREA  As needed        01/20/23 0114             Note:  This document was prepared using Dragon voice recognition software and may include unintentional dictation errors.    Pilar Jarvis, MD 01/20/23 [phone]

## 2023-01-26 ENCOUNTER — Other Ambulatory Visit: Payer: Self-pay | Admitting: Internal Medicine

## 2023-02-02 ENCOUNTER — Ambulatory Visit: Payer: Medicare PPO | Attending: Internal Medicine | Admitting: Internal Medicine

## 2023-02-02 ENCOUNTER — Encounter: Payer: Self-pay | Admitting: Internal Medicine

## 2023-02-02 VITALS — BP 168/76 | HR 76 | Temp 97.7°F | Ht 62.5 in | Wt 171.1 lb

## 2023-02-02 DIAGNOSIS — I10 Essential (primary) hypertension: Secondary | ICD-10-CM | POA: Diagnosis not present

## 2023-02-02 DIAGNOSIS — I471 Supraventricular tachycardia, unspecified: Secondary | ICD-10-CM | POA: Diagnosis not present

## 2023-02-02 DIAGNOSIS — I38 Endocarditis, valve unspecified: Secondary | ICD-10-CM

## 2023-02-02 DIAGNOSIS — Z79899 Other long term (current) drug therapy: Secondary | ICD-10-CM

## 2023-02-02 MED ORDER — LOSARTAN POTASSIUM 100 MG PO TABS: 100.0000 mg | ORAL_TABLET | Freq: Every day | ORAL | 3 refills | Status: DC

## 2023-02-02 NOTE — Patient Instructions (Signed)
Medication Instructions:  Your physician recommends the following medication changes.  INCREASE: Losartan to 100 mg by mouth daily   *If you need a refill on your cardiac medications before your next appointment, please call your pharmacy*   Lab Work: Your provider would like for you to return in 2 weeks to have the following labs drawn: (BMP).   Please go to [facility] [address] ([facility] [address], [place] [address] You do not need an appointment.  They are open from 7:30 am-4 pm.  Lunch from 1:00 pm- 2:00 pm You will not need to be fasting.   Testing/Procedures: No test ordered today    Follow-Up: At [facility], you and your health needs are our priority.  As part of our continuing mission to provide you with exceptional heart care, we have created designated Provider Care Teams.  These Care Teams include your primary Cardiologist (physician) and Advanced Practice Providers (APPs -  Physician Assistants and Nurse Practitioners) who all work together to provide you with the care you need, when you need it.  We recommend signing up for the patient portal called "MyChart".  Sign up information is provided on this After Visit Summary.  MyChart is used to connect with patients for Virtual Visits (Telemedicine).  Patients are able to view lab/test results, encounter notes, upcoming appointments, etc.  Non-urgent messages can be sent to your provider as well.   To learn more about what you can do with MyChart, go to [email or web address].    Your next appointment:   3 month(s)  Provider:   You may see [name], MD or one of the following Advanced Practice Providers on your designated Care Team:   Nicolasa Ducking, NP Eula Listen, PA-C Cadence Fransico Michael, PA-C Charlsie Quest, NP

## 2023-02-02 NOTE — Progress Notes (Signed)
Cardiology Office Note:  .   Date:  02/03/2023  ID:  Holly Harrington, DOB 1945-07-26, MRN [id number] PCP: [facility] Providers Cardiologist:  [name], MD     History of Present Illness: .   Holly Harrington is a 77 y.o. 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  I last saw her in June, at which time she was most bothered by pain and paresthesias in the left arm that had only improved transiently with corticosteroids and NSAIDs.  She was scheduled to see physical therapy and a spine specialist at [facility].  From a heart standpoint, she denied chest pain, palpitations, lightheadedness, and edema, though she still had stable 3 pillow orthopnea.  We discussed moving forward with TEE to better understand her mitral regurgitation but agreed to defer this.  Transthoracic echocardiogram earlier this month showed normal LVEF with moderate MR and mild to moderate AI.  Today, Holly Harrington reports that she is feeling fairly well, having recovered from a mechanical fall last week that landed her in the ER due to headache and neck stiffness.  No acute traumatic injury was identified, though she feels like she may have had a mild concussion.  Since her fall, she has been having some new pain in her hips.  She has already been evaluated by the spine center and continues treatment with them.  She has felt a little more fatigued since our last visit but overall is relatively stable.  Exertional dyspnea is unchanged.  She notes a tiny bit of chest pain after a recent outing.  It came and went for over a day, prompting her to take diltiazem.  The next morning, the symptoms had resolved.  She denies any leg swelling or orthopnea.  She notes 1 brief episode of lightheadedness over the last 6 months.  She has not felt  like she would pass out.  ROS: See HPI  Studies Reviewed: Marland Kitchen        TTE (01/19/2023):  1. Left ventricular ejection fraction, by estimation, is 60 to 65%. The  left ventricle has normal function. The left ventricle has no regional  wall motion abnormalities. There is mild left ventricular hypertrophy.  Left ventricular diastolic parameters  are indeterminate.   2. Right ventricular systolic function is normal. The right ventricular  size is normal. There is normal pulmonary artery systolic pressure.   3. Left atrial size was moderately dilated.   4. The mitral valve is abnormal. Moderate mitral valve regurgitation. No  evidence of mitral stenosis. Moderate mitral annular calcification.   5. The aortic valve is normal in structure. Aortic valve regurgitation is  mild to moderate. Aortic valve sclerosis/calcification is present, without  any evidence of aortic stenosis.   6. Aortic dilatation noted. There is borderline dilatation of the  ascending aorta, measuring 38 mm.   7. The inferior vena cava is normal in size with greater than 50%  respiratory variability, suggesting right atrial pressure of 3 mmHg.   Risk Assessment/Calculations:     HYPERTENSION CONTROL Vitals:   02/02/23 0938 02/02/23 0944 02/02/23 0958  BP: (!) 160/60 133/77 (!) 168/76    The patient's blood pressure is elevated above target today.  In order to address the patient's elevated BP: A current anti-hypertensive medication was adjusted today.  Physical Exam:   VS:  BP (!) 168/76 (BP Location: Left Arm)   Pulse 76   Temp 97.7 F (36.5 C)   Ht 5' 2.5" (1.588 m)   Wt 171 lb 2 oz (77.6 kg)   SpO2 97%   BMI 30.80 kg/m    Wt Readings from Last 3 Encounters:  02/02/23 171 lb 2 oz (77.6 kg)  01/19/23 176 lb 5.9 oz (80 kg)  10/03/22 176 lb 6.4 oz (80 kg)    General:  NAD. Neck: No JVD or HJR. Lungs: Faint wheezes and bibasilar crackles. Heart: Regular rate and rhythm with 1/6 systolic  murmur.  No rubs or gallops. Abdomen: Soft, nontender, nondistended. Extremities: No lower extremity edema.  ASSESSMENT AND PLAN: .    Valvular heart disease: Holly Harrington overall feels about the same as at prior visits though she has experienced slight progression and overall fatigue.  Her echocardiogram earlier this month showed stable findings of normal LVEF with moderate mitral regurgitation and mild to moderate aortic regurgitation.  We again discussed role for further evaluation with TEE +/- right and left heart catheterization.  Holly Harrington wishes to defer this again in favor of watchful waiting.  We will see her back in the office in about 3 months and plan to repeat an echocardiogram in about a year, sooner if symptoms worsen.  Hypertension: Blood pressure quite elevated today.  We have agreed to increase losartan to 100 mg daily and repeat a BMP in 2 weeks.  PSVT: No palpitations reported.  Holly Harrington can continue to use her as needed diltiazem.    Dispo: Return to clinic in 3 months.  Signed, [name], MD

## 2023-02-03 ENCOUNTER — Encounter: Payer: Self-pay | Admitting: Internal Medicine

## 2023-02-17 LAB — BASIC METABOLIC PANEL
BUN/Creatinine Ratio: 17 (ref 12–28)
BUN: 15 mg/dL (ref 8–27)
CO2: 23 mmol/L (ref 20–29)
Calcium: 9.8 mg/dL (ref 8.7–10.3)
Chloride: 104 mmol/L (ref 96–106)
Creatinine, Ser: 0.86 mg/dL (ref 0.57–1.00)
Glucose: 99 mg/dL (ref 70–99)
Potassium: 4.4 mmol/L (ref 3.5–5.2)
Sodium: 142 mmol/L (ref 134–144)
eGFR: 69 mL/min/{1.73_m2} (ref >59)

## 2023-04-28 ENCOUNTER — Telehealth: Payer: Self-pay | Admitting: Internal Medicine

## 2023-04-28 NOTE — Telephone Encounter (Signed)
   Patient Name: Holly Harrington  DOB: 11/01/1945 MRN: [id number]  Primary Cardiologist: [name], MD  Chart reviewed as part of pre-operative protocol coverage. Cataract extractions are recognized in guidelines as low risk surgeries that do not typically require specific preoperative testing or holding of blood thinner therapy. Therefore, given past medical history and time since last visit, based on ACC/AHA guidelines, Holly Harrington would be at acceptable risk for the planned procedure without further cardiovascular testing.   I will route this recommendation to the requesting party via Epic fax function and remove from pre-op pool.  Please call with questions.  Joylene Grapes, NP 04/28/2023, 4:05 PM

## 2023-04-28 NOTE — Telephone Encounter (Signed)
   Pre-operative Risk Assessment    Patient Name: Holly Harrington  DOB: 1946/03/22 MRN: [id number]   Date of last office visit: 02/02/23 Date of next office visit: 05/10/23   Request for Surgical Clearance    Procedure:  Cataract right & Left eye  Date of Surgery:  Clearance TBD                                Surgeon:  Dr. Delaney Meigs Surgeon's Group or Practice Name:  [facility] Phone number:  [phone] Fax number:  [phone]   Type of Clearance Requested:   - Medical    Type of Anesthesia:  Not Indicated   Additional requests/questions:    Signed, Narda Amber   04/28/2023, 3:53 PM

## 2023-05-10 ENCOUNTER — Ambulatory Visit: Payer: Medicare PPO | Attending: Internal Medicine | Admitting: Internal Medicine

## 2023-05-10 ENCOUNTER — Encounter: Payer: Self-pay | Admitting: Internal Medicine

## 2023-05-10 VITALS — BP 126/58 | HR 77 | Ht 62.5 in | Wt 175.0 lb

## 2023-05-10 DIAGNOSIS — I38 Endocarditis, valve unspecified: Secondary | ICD-10-CM

## 2023-05-10 DIAGNOSIS — I080 Rheumatic disorders of both mitral and aortic valves: Secondary | ICD-10-CM

## 2023-05-10 DIAGNOSIS — I10 Essential (primary) hypertension: Secondary | ICD-10-CM | POA: Diagnosis not present

## 2023-05-10 DIAGNOSIS — I471 Supraventricular tachycardia, unspecified: Secondary | ICD-10-CM | POA: Diagnosis not present

## 2023-05-10 DIAGNOSIS — R079 Chest pain, unspecified: Secondary | ICD-10-CM

## 2023-05-10 MED ORDER — DILTIAZEM HCL 30 MG PO TABS: 30.0000 mg | ORAL_TABLET | Freq: Four times a day (QID) | ORAL | 5 refills | Status: DC | PRN

## 2023-05-10 NOTE — Patient Instructions (Signed)
Medication Instructions:  Your physician recommends that you continue on your current medications as directed. Please refer to the Current Medication list given to you today.   *If you need a refill on your cardiac medications before your next appointment, please call your pharmacy*   Lab Work: No labs ordered today    Testing/Procedures: No test ordered today    Follow-Up: At [facility], you and your health needs are our priority.  As part of our continuing mission to provide you with exceptional heart care, we have created designated Provider Care Teams.  These Care Teams include your primary Cardiologist (physician) and Advanced Practice Providers (APPs -  Physician Assistants and Nurse Practitioners) who all work together to provide you with the care you need, when you need it.  We recommend signing up for the patient portal called "MyChart".  Sign up information is provided on this After Visit Summary.  MyChart is used to connect with patients for Virtual Visits (Telemedicine).  Patients are able to view lab/test results, encounter notes, upcoming appointments, etc.  Non-urgent messages can be sent to your provider as well.   To learn more about what you can do with MyChart, go to [email or web address].    Your next appointment:   6 month(s)  Provider:   You may see [name], MD or one of the following Advanced Practice Providers on your designated Care Team:   Nicolasa Ducking, NP Eula Listen, PA-C Cadence Fransico Michael, PA-C Charlsie Quest, NP Carlos Levering, NP

## 2023-05-10 NOTE — Progress Notes (Unsigned)
Cardiology Office Note:  .   Date:  05/11/2023  ID:  ELIZABEHT Harrington, DOB 01/08/1946, MRN [id number] PCP: [facility] Providers Cardiologist:  [name], MD     History of Present Illness: .   Holly Harrington is a 78 y.o. 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  I last saw her in 01/2023, at which time she was recovering from a mechanical fall.  She believes she may have suffered a mild concussion and continued to have hip pain.  She noted a little more fatigue than at prior visits with stable exertional dyspnea and minimal chest pain.  She continued to decline TEE +/- R/LHC that had been discussed at multiple prior visits in favor of watchful waiting.  Due to moderately elevated blood pressure, we agreed to escalate losartan to 100 mg daily.  Today, [name] reports that she feels fairly well, denying chest pain, shortness of breath, palpitations, lightheadedness, and edema.  She underwent PFT's [facility] at [facility] an notes that she was worn out after the test.  She has not needed to use prn diltiazem in quite some time.  She has not been checking her BP regularly at home but notes that it has been elevated at times at other doctors visits.  ROS: See HPI  Studies Reviewed: Marland Kitchen   EKG Interpretation Date/Time:  Wednesday May 10 2023 16:11:36 EST Ventricular Rate:  77 PR Interval:  198 QRS Duration:  98 QT Interval:  366 QTC Calculation: 414 R Axis:   -3  Text Interpretation: Normal sinus rhythm Minimal voltage criteria for LVH, may be normal variant ( Cornell product ) Septal infarct , age undetermined versus lead placement When compared with ECG of 03-Oct-2022 PR interval has decreased Otherwise no significant change Confirmed by [name] [phone]) on 05/11/2023 5:38:43  PM    TTE (01/19/2023): Normal LV size with mild LVH.  LVEF 60-65% with normal wall motion.  Diastolic parameters indeterminate.  Normal RV size and function.  Normal PA pressure.  Moderate left atrial enlargement.  No pericardial effusion.  Degenerative mitral valve with moderate MAC and moderate regurgitation.  No mitral stenosis.  Mild TR.  Mild to moderate aortic regurgitation as well as aortic sclerosis without stenosis.  Normal CVP.  Risk Assessment/Calculations:             Physical Exam:   VS:  BP (!) 126/58 (BP Location: Left Arm, Patient Position: Sitting, Cuff Size: Normal)   Pulse 77   Ht 5' 2.5" (1.588 m)   Wt 175 lb (79.4 kg)   SpO2 97%   BMI 31.50 kg/m    Wt Readings from Last 3 Encounters:  05/10/23 175 lb (79.4 kg)  02/02/23 171 lb 2 oz (77.6 kg)  01/19/23 176 lb 5.9 oz (80 kg)    General:  NAD. Neck: No JVD or HJR. Lungs: Coarse breath sounds bilaterally without wheezes or crackles. Heart: Regular rate and rhythm with 1/6 systolic murmur. Abdomen: Soft, nontender, nondistended. Extremities: No lower extremity edema.  ASSESSMENT AND PLAN: .    Valvular heart disease: [name] appears euvolemic with stable NYHA class II symptoms.  Last echo in 01/2023 was reassuring with normal LVEF and moderate MR and mild-moderate AI.  We have agreed to defer further testing for now and continue clinical follow-up.  She will need  a follow-up echocardiogram around 01/2024; we will readdress this at our next follow-up visit.  Hypertension: BP improved today following escalation of losartan at our last visit.  We will continue losartan 100 mg daily.  PSVT: No palpitations noted.  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    Dispo: Return to clinic in 6 months.  Signed, [name], MD

## 2023-05-11 ENCOUNTER — Encounter: Payer: Self-pay | Admitting: Internal Medicine

## 2023-06-13 ENCOUNTER — Other Ambulatory Visit: Payer: Self-pay | Admitting: Obstetrics and Gynecology

## 2023-06-13 DIAGNOSIS — Z1231 Encounter for screening mammogram for malignant neoplasm of breast: Secondary | ICD-10-CM

## 2023-08-02 ENCOUNTER — Ambulatory Visit

## 2023-08-09 ENCOUNTER — Other Ambulatory Visit: Payer: Self-pay | Admitting: Obstetrics and Gynecology

## 2023-08-09 ENCOUNTER — Ambulatory Visit
Admission: RE | Admit: 2023-08-09 | Discharge: 2023-08-09 | Disposition: A | Discharge status: Home / Self Care | Source: Ambulatory Visit | Attending: Obstetrics and Gynecology | Admitting: Obstetrics and Gynecology

## 2023-08-09 DIAGNOSIS — M810 Age-related osteoporosis without current pathological fracture: Secondary | ICD-10-CM

## 2023-08-09 DIAGNOSIS — Z1231 Encounter for screening mammogram for malignant neoplasm of breast: Secondary | ICD-10-CM

## 2023-12-27 ENCOUNTER — Emergency Department

## 2023-12-27 ENCOUNTER — Inpatient Hospital Stay
Admission: EM | Admit: 2023-12-27 | Discharge: 2023-12-30 | DRG: 871 | Disposition: A | Discharge status: Home / Self Care | Attending: Internal Medicine | Admitting: Internal Medicine

## 2023-12-27 ENCOUNTER — Telehealth: Payer: Self-pay | Admitting: Internal Medicine

## 2023-12-27 ENCOUNTER — Other Ambulatory Visit: Payer: Self-pay

## 2023-12-27 ENCOUNTER — Ambulatory Visit: Attending: Internal Medicine | Admitting: Internal Medicine

## 2023-12-27 ENCOUNTER — Encounter: Payer: Self-pay | Admitting: Internal Medicine

## 2023-12-27 VITALS — BP 150/86 | HR 88 | Ht 62.5 in | Wt 171.6 lb

## 2023-12-27 DIAGNOSIS — R7989 Other specified abnormal findings of blood chemistry: Secondary | ICD-10-CM | POA: Diagnosis not present

## 2023-12-27 DIAGNOSIS — J4520 Mild intermittent asthma, uncomplicated: Secondary | ICD-10-CM | POA: Diagnosis not present

## 2023-12-27 DIAGNOSIS — Z79899 Other long term (current) drug therapy: Secondary | ICD-10-CM | POA: Diagnosis not present

## 2023-12-27 DIAGNOSIS — I214 Non-ST elevation (NSTEMI) myocardial infarction: Secondary | ICD-10-CM | POA: Diagnosis not present

## 2023-12-27 DIAGNOSIS — Z8616 Personal history of COVID-19: Secondary | ICD-10-CM

## 2023-12-27 DIAGNOSIS — M810 Age-related osteoporosis without current pathological fracture: Secondary | ICD-10-CM | POA: Diagnosis present

## 2023-12-27 DIAGNOSIS — Z803 Family history of malignant neoplasm of breast: Secondary | ICD-10-CM

## 2023-12-27 DIAGNOSIS — I700 Atherosclerosis of aorta: Secondary | ICD-10-CM | POA: Diagnosis present

## 2023-12-27 DIAGNOSIS — R0989 Other specified symptoms and signs involving the circulatory and respiratory systems: Secondary | ICD-10-CM | POA: Insufficient documentation

## 2023-12-27 DIAGNOSIS — I10 Essential (primary) hypertension: Secondary | ICD-10-CM | POA: Diagnosis present

## 2023-12-27 DIAGNOSIS — A419 Sepsis, unspecified organism: Principal | ICD-10-CM | POA: Diagnosis present

## 2023-12-27 DIAGNOSIS — Z90710 Acquired absence of both cervix and uterus: Secondary | ICD-10-CM

## 2023-12-27 DIAGNOSIS — Z7951 Long term (current) use of inhaled steroids: Secondary | ICD-10-CM | POA: Diagnosis not present

## 2023-12-27 DIAGNOSIS — I2489 Other forms of acute ischemic heart disease: Secondary | ICD-10-CM | POA: Diagnosis present

## 2023-12-27 DIAGNOSIS — J189 Pneumonia, unspecified organism: Secondary | ICD-10-CM | POA: Diagnosis present

## 2023-12-27 DIAGNOSIS — Z8249 Family history of ischemic heart disease and other diseases of the circulatory system: Secondary | ICD-10-CM

## 2023-12-27 DIAGNOSIS — Z823 Family history of stroke: Secondary | ICD-10-CM | POA: Diagnosis not present

## 2023-12-27 DIAGNOSIS — I4891 Unspecified atrial fibrillation: Secondary | ICD-10-CM

## 2023-12-27 DIAGNOSIS — I38 Endocarditis, valve unspecified: Secondary | ICD-10-CM

## 2023-12-27 DIAGNOSIS — I080 Rheumatic disorders of both mitral and aortic valves: Secondary | ICD-10-CM | POA: Diagnosis present

## 2023-12-27 DIAGNOSIS — Z885 Allergy status to narcotic agent status: Secondary | ICD-10-CM

## 2023-12-27 DIAGNOSIS — G4733 Obstructive sleep apnea (adult) (pediatric): Secondary | ICD-10-CM | POA: Diagnosis present

## 2023-12-27 DIAGNOSIS — E782 Mixed hyperlipidemia: Secondary | ICD-10-CM | POA: Diagnosis present

## 2023-12-27 DIAGNOSIS — R531 Weakness: Secondary | ICD-10-CM | POA: Diagnosis not present

## 2023-12-27 DIAGNOSIS — J45909 Unspecified asthma, uncomplicated: Secondary | ICD-10-CM | POA: Diagnosis present

## 2023-12-27 DIAGNOSIS — Z79620 Long term (current) use of immunosuppressive biologic: Secondary | ICD-10-CM

## 2023-12-27 DIAGNOSIS — R0602 Shortness of breath: Secondary | ICD-10-CM | POA: Diagnosis present

## 2023-12-27 LAB — CBC
HCT: 49.3 % — ABNORMAL HIGH (ref 36.0–46.0)
Hemoglobin: 14.1 g/dL (ref 12.0–15.0)
MCH: 27.9 pg (ref 26.0–34.0)
MCHC: 28.6 g/dL — ABNORMAL LOW (ref 30.0–36.0)
MCV: 97.4 fL (ref 80.0–100.0)
Platelets: 310 K/uL (ref 150–400)
RBC: 5.06 MIL/uL (ref 3.87–5.11)
RDW: 13.9 % (ref 11.5–15.5)
WBC: 10.2 K/uL (ref 4.0–10.5)
nRBC: 0 % (ref 0.0–0.2)

## 2023-12-27 LAB — BASIC METABOLIC PANEL WITH GFR
Anion gap: 14 (ref 5–15)
BUN: 17 mg/dL (ref 8–23)
CO2: 19 mmol/L — ABNORMAL LOW (ref 22–32)
Calcium: 9.6 mg/dL (ref 8.9–10.3)
Chloride: 104 mmol/L (ref 98–111)
Creatinine, Ser: 0.71 mg/dL (ref 0.44–1.00)
GFR, Estimated: >60 mL/min (ref >60)
Glucose, Bld: 93 mg/dL (ref 70–99)
Potassium: 4.7 mmol/L (ref 3.5–5.1)
Sodium: 137 mmol/L (ref 135–145)

## 2023-12-27 LAB — TROPONIN I (HIGH SENSITIVITY): Troponin I (High Sensitivity): 689 ng/L (ref <18)

## 2023-12-27 LAB — BRAIN NATRIURETIC PEPTIDE: B Natriuretic Peptide: 168 pg/mL — ABNORMAL HIGH (ref 0.0–100.0)

## 2023-12-27 MED ORDER — ATORVASTATIN CALCIUM 80 MG PO TABS
80.0000 mg | ORAL_TABLET | Freq: Every day | ORAL | Status: DC
  Administered 2023-12-28 – 2023-12-29 (×3): 80 mg via ORAL
  Filled 2023-12-27: qty 1
  Filled 2023-12-27: qty 4
  Filled 2023-12-27: qty 1

## 2023-12-27 MED ORDER — ACETAMINOPHEN 325 MG PO TABS: 650.0000 mg | ORAL_TABLET | Freq: Four times a day (QID) | ORAL | Status: DC | PRN

## 2023-12-27 MED ORDER — GUAIFENESIN ER 600 MG PO TB12
600.0000 mg | ORAL_TABLET | Freq: Two times a day (BID) | ORAL | Status: DC
Start: 2023-12-27 — End: 2023-12-30
  Administered 2023-12-28 – 2023-12-30 (×6): 600 mg via ORAL
  Filled 2023-12-27 (×6): qty 1

## 2023-12-27 MED ORDER — IPRATROPIUM-ALBUTEROL 0.5-2.5 (3) MG/3ML IN SOLN
3.0000 mL | RESPIRATORY_TRACT | Status: DC | PRN
  Administered 2023-12-29: 3 mL via RESPIRATORY_TRACT

## 2023-12-27 MED ORDER — TRAZODONE HCL 50 MG PO TABS: 25.0000 mg | ORAL_TABLET | Freq: Every evening | ORAL | Status: DC | PRN

## 2023-12-27 MED ORDER — SODIUM CHLORIDE 0.9 % IV SOLN
1.0000 g | Freq: Once | INTRAVENOUS | Status: AC
  Administered 2023-12-28: 1 g via INTRAVENOUS
  Filled 2023-12-27: qty 10

## 2023-12-27 MED ORDER — SODIUM CHLORIDE 0.9 % IV SOLN
2.0000 g | INTRAVENOUS | Status: AC
  Administered 2023-12-28 – 2023-12-29 (×2): 2 g via INTRAVENOUS
  Filled 2023-12-27 (×2): qty 20

## 2023-12-27 MED ORDER — SODIUM CHLORIDE 0.9 % IV SOLN
1.0000 g | Freq: Once | INTRAVENOUS | Status: AC
  Administered 2023-12-27: 1 g via INTRAVENOUS
  Filled 2023-12-27: qty 10

## 2023-12-27 MED ORDER — MORPHINE SULFATE (PF) 2 MG/ML IV SOLN: 2.0000 mg | INTRAVENOUS | Status: DC | PRN

## 2023-12-27 MED ORDER — MAGNESIUM HYDROXIDE 400 MG/5ML PO SUSP: 30.0000 mL | Freq: Every day | ORAL | Status: DC | PRN

## 2023-12-27 MED ORDER — LOSARTAN POTASSIUM 50 MG PO TABS
100.0000 mg | ORAL_TABLET | Freq: Every day | ORAL | Status: DC
  Administered 2023-12-28 – 2023-12-29 (×3): 100 mg via ORAL
  Filled 2023-12-27 (×3): qty 2

## 2023-12-27 MED ORDER — HEPARIN BOLUS VIA INFUSION
4000.0000 [IU] | Freq: Once | INTRAVENOUS | Status: AC
Start: 2023-12-27 — End: 2023-12-27
  Administered 2023-12-27: 4000 [IU] via INTRAVENOUS
  Filled 2023-12-27: qty 4000

## 2023-12-27 MED ORDER — SODIUM CHLORIDE 0.9 % IV SOLN
500.0000 mg | INTRAVENOUS | Status: AC
  Administered 2023-12-28 – 2023-12-29 (×2): 500 mg via INTRAVENOUS
  Filled 2023-12-27 (×2): qty 5

## 2023-12-27 MED ORDER — DILTIAZEM HCL 60 MG PO TABS
30.0000 mg | ORAL_TABLET | Freq: Four times a day (QID) | ORAL | Status: DC | PRN
Start: 2023-12-27 — End: 2023-12-28

## 2023-12-27 MED ORDER — SODIUM CHLORIDE 0.9 % IV SOLN
500.0000 mg | Freq: Once | INTRAVENOUS | Status: AC
  Administered 2023-12-27: 500 mg via INTRAVENOUS
  Filled 2023-12-27: qty 5

## 2023-12-27 MED ORDER — IOHEXOL 350 MG/ML SOLN
75.0000 mL | Freq: Once | INTRAVENOUS | Status: AC | PRN
  Administered 2023-12-27: 75 mL via INTRAVENOUS

## 2023-12-27 MED ORDER — VITAMIN D 25 MCG (1000 UNIT) PO TABS
1000.0000 [IU] | ORAL_TABLET | Freq: Every evening | ORAL | Status: DC
  Administered 2023-12-28 – 2023-12-29 (×2): 1000 [IU] via ORAL
  Filled 2023-12-27 (×2): qty 1

## 2023-12-27 MED ORDER — HEPARIN (PORCINE) 25000 UT/250ML-% IV SOLN
1200.0000 [IU]/h | INTRAVENOUS | Status: DC
  Administered 2023-12-27: 800 [IU]/h via INTRAVENOUS
  Administered 2023-12-28: 1200 [IU]/h via INTRAVENOUS
  Filled 2023-12-27 (×2): qty 250

## 2023-12-27 MED ORDER — LACTATED RINGERS IV SOLN
150.0000 mL/h | INTRAVENOUS | Status: DC
  Administered 2023-12-28: 150 mL/h via INTRAVENOUS

## 2023-12-27 MED ORDER — IPRATROPIUM-ALBUTEROL 0.5-2.5 (3) MG/3ML IN SOLN
3.0000 mL | Freq: Four times a day (QID) | RESPIRATORY_TRACT | Status: DC
  Administered 2023-12-28 (×4): 3 mL via RESPIRATORY_TRACT
  Filled 2023-12-27 (×6): qty 3

## 2023-12-27 MED ORDER — ONDANSETRON HCL 4 MG PO TABS: 4.0000 mg | ORAL_TABLET | Freq: Four times a day (QID) | ORAL | Status: DC | PRN

## 2023-12-27 MED ORDER — ACETAMINOPHEN 650 MG RE SUPP: 650.0000 mg | Freq: Four times a day (QID) | RECTAL | Status: DC | PRN

## 2023-12-27 MED ORDER — ASPIRIN 81 MG PO CHEW
324.0000 mg | CHEWABLE_TABLET | Freq: Once | ORAL | Status: AC
  Administered 2023-12-27: 324 mg via ORAL
  Filled 2023-12-27: qty 4

## 2023-12-27 MED ORDER — ONDANSETRON HCL 4 MG/2ML IJ SOLN: 4.0000 mg | Freq: Four times a day (QID) | INTRAMUSCULAR | Status: DC | PRN

## 2023-12-27 MED ORDER — NITROGLYCERIN 0.4 MG SL SUBL: 0.4000 mg | SUBLINGUAL_TABLET | SUBLINGUAL | Status: DC | PRN

## 2023-12-27 NOTE — ED Triage Notes (Signed)
 Pt is coming from cardiology clinic. Clinic reports crackles in lung sounds and new onset a-fib. PMH: asthma, pulmonary fibrosis,SVT, HTN. Recent dx of Covid 2 weeks ago and reports new onset dyspnea with exertion since diagnosis. GCS 15

## 2023-12-27 NOTE — Consult Note (Signed)
 Pharmacy Consult Note - Anticoagulation  Pharmacy Consult for Heparin  Indication: chest pain/ACS Allergies  Allergen Reactions   Hydrocodone    Oxycodone    Codeine Nausea And Vomiting    Other Reaction(s): Other (See Comments)    PATIENT MEASUREMENTS:      VITAL SIGNS: Temp: 98 F (36.7 C) (09/17 2021) Temp Source: Oral (09/17 2021) BP: 195/83 (09/17 2030) Pulse Rate: 88 (09/17 2030)  Recent Labs    12/27/23 1729 12/27/23 2052  HGB 14.1  --   HCT 49.3*  --   PLT 310  --   CREATININE 0.71  --   TROPONINIHS  --  689*    Estimated Creatinine Clearance: 54.8 mL/min (by C-G formula based on SCr of 0.71 mg/dL).  PAST MEDICAL HISTORY: Past Medical History:  Diagnosis Date   Asthma    Breast lump    Corneal ulcer    Fibroid    Hypertension    Obstructive sleep apnea    Osteopenia    Osteoporosis    PSVT (paroxysmal supraventricular tachycardia) (HCC)    Valvular heart disease 01/2020   Moderate-severe mitral regurgitation; mild aortic regurgitation    Medications:  (Not in a hospital admission)  Scheduled:   aspirin   324 mg Oral Once   heparin   4,000 Units Intravenous Once   Infusions:   heparin      PRN:   ASSESSMENT: 78 y.o. female with PMH SVT is presenting with SOB. Patient is not on chronic anticoagulation per chart review. Pharmacy has been consulted to initiate and manage heparin  intravenous infusion.   Goal(s) of therapy: Heparin  level 0.3 - 0.7 units/mL aPTT 66 - 102 seconds Monitor platelets by anticoagulation protocol: Yes   Baseline anticoagulation labs: Recent Labs    12/27/23 1729  HGB 14.1  PLT 310  Baseline labs pending: HL, INR, aPTT    PLAN:  Give 4000 units bolus x1; then start heparin  infusion at 800 units/hour.  Check heparin  level in 8 hours, then daily once at least two levels are consecutively therapeutic.  Monitor CBC daily while on heparin  infusion.  Annabella LOISE Banks, PharmD Clinical  Pharmacist 12/27/2023 10:04 PM

## 2023-12-27 NOTE — Patient Instructions (Signed)
 Medication Instructions:  Your physician recommends that you continue on your current medications as directed. Please refer to the Current Medication list given to you today.    *If you need a refill on your cardiac medications before your next appointment, please call your pharmacy*  Lab Work: No labs ordered today    Testing/Procedures: No test ordered today   Follow-Up: At [facility], you and your health needs are our priority.  As part of our continuing mission to provide you with exceptional heart care, our providers are all part of one team.  This team includes your primary Cardiologist (physician) and Advanced Practice Providers or APPs (Physician Assistants and Nurse Practitioners) who all work together to provide you with the care you need, when you need it.  Your next appointment:   Based on hospitalization   Provider:   You may see Lonni Hanson, MD or one of the following Advanced Practice Providers on your designated Care Team:   Lonni Meager, NP Lesley Maffucci, PA-C Bernardino Bring, PA-C Cadence [place], PA-C Tylene Lunch, NP Barnie Hila, NP

## 2023-12-27 NOTE — Telephone Encounter (Signed)
 Call placed to the patient concerning her elevated blood pressure. She stated that she was working in the yard for a few hours today and started to feel off. She went inside and checked her blood pressure and it was:  188/97 110,  163/109 89,  173/94 107   While on the phone blood pressure was 168/81 and heart rate 92.  She did take a prn 30 mg Diltiazem  around 10:30 am.   She takes Losartan  100 mg once daily in the evening.   She stated that she is feeling a little better but still a bit shaky. She wants to know if she can take an additional 50 mg Losartan  now.   She does not check her blood pressure on a regular basis. She has been advised that it may be best to do this to monitor her readings.

## 2023-12-27 NOTE — Progress Notes (Signed)
 Cardiology Office Note:  .   Date:  12/27/2023  ID:  Holly Harrington, DOB [birth year], MRN [id number] PCP: [facility]  [facility] Providers Cardiologist:  Lonni Hanson, MD     History of Present Illness: .   Holly Harrington is a 78 y.o. 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  She reports that she initially developed myalgias followed by generalized weakness in early September.  For a few days, she was so weak that it was hard to get out of bed.  She did not seek medical care at that time.  After several days, she tested herself for COVID-19 and found it was positive.  She did not seek treatment, as she was already starting to feel better.  She has been having an intermittent cough since then, predominantly at night.  This morning, she went outside to work in her garden and suddenly began to feel more tired and short of breath around 9 AM.  She was then able to continue working in the yard and came back inside to take her as needed diltiazem .  When bending over to retrieve this from her cabinet, she felt quite short of breath again.  She checked her blood pressure couple of times and noted BP and heart rate readings of 163-188/94-109 mmHg and 89-110 bpm, respectively.  She contacted our office and was instructed to come in for further evaluation.  At this time, Holly Harrington reports that she is feeling a little bit better though she is still weak and a little short of breath.  She denies chest pain, palpitations, lightheadedness, and edema.  She has been compliant with her medications.  ROS: See HPI  Studies Reviewed: SABRA   EKG Interpretation Date/Time:  Wednesday December 27 2023 16:38:32 EDT Ventricular Rate:  88 PR Interval:    QRS  Duration:  96 QT Interval:  362 QTC Calculation: 438 R Axis:   -12  Text Interpretation: Atrial fibrillation Septal infarct (cited on or before 10-May-2023) Abnormal ECG When compared with ECG of 10-May-2023 16:11, Atrial fibrillation has replaced Sinus rhythm Confirmed by Rosalena Mccorry [phone]) on 12/27/2023 5:12:49 PM    TTE (01/19/2023): Normal LV size with mild LVH.  LVEF 6-65% with indeterminate diastolic parameters.  Normal RV size and function.  Normal PA pressure.  Moderate left atrial enlargement.  No pericardial effusion.  Moderate mitral annular calcification with moderate regurgitation.  No mitral stenosis.  Mild tricuspid regurgitation.  Mild-moderate aortic regurgitation along with aortic sclerosis without stenosis.  Normal CVP.  Risk Assessment/Calculations:    CHA2DS2-VASc Score = 4   This indicates a 4.8% annual risk of stroke. The patient's score is based upon: CHF History: 0 HTN History: 1 Diabetes History: 0 Stroke History: 0 Vascular Disease History: 0 Age Score: 2 Gender Score: 1           Physical Exam:   VS:  BP (!) 150/86 (BP Location: Right Arm, Patient Position: Sitting, Cuff Size: Normal)   Pulse 88   Ht 5' 2.5 (1.588 m)   Wt 171 lb 9.6 oz (77.8 kg)   SpO2 98%   BMI 30.89 kg/m    Wt Readings from Last 3 Encounters:  12/27/23 171 lb 9.6 oz (77.8 kg)  05/10/23 175 lb (79.4 kg)  02/02/23 171 lb 2 oz (77.6 kg)  General:  NAD. Neck: No JVD or HJR. Lungs: Mildly diminished breath sounds throughout with crackles at the left lung base. Heart: Irregularly irregular rhythm with 2/6 systolic murmur. Abdomen: Soft, nontender, nondistended. Extremities: No lower extremity edema.  ASSESSMENT AND PLAN: .    New onset atrial fibrillation: Holly Harrington is noted to be in atrial fibrillation today with reasonable rate control.  I suspect that the as needed diltiazem  that she took earlier today for her history of PSVT may have lowered her heart rate, as  rates at home more closer to 110 bpm.  Query if recent COVID-19 infection precipitated atrial fibrillation, though she is also at risk in the setting of her chronic valvular heart disease.  She certainly qualifies for anticoagulation in the setting of a CHA2DS2-VASc score of 4.  However, given her significant dyspnea and concern for possible pneumonia, I will refer her to the ER, where anticoagulation can be initiated as appropriate.  Shortness of breath and left lung base crackles: Holly Harrington notes increased shortness of breath that began earlier today.  This certainly could be due to atrial fibrillation though she is still short of breath with minimal activity at this time despite her ventricular rates being well-controlled.  Physical exam notable for crackles at the left lung base, concerning for possible pneumonia.  Asymmetric pulmonary edema cannot be excluded either.  I have recommended further evaluation in the ED, which Holly Harrington is in agreement with.  Hypertension: Blood pressure moderately elevated in the office today, more severely elevated at home.  Acute illness could be confounding this.  Defer medication change at this time pending ED evaluation.  Valvular heart disease: Mild to moderate AI and moderate MR noted on most recent echocardiogram a year ago.  In the setting of new onset atrial fibrillation, repeat echocardiogram should be obtained.  This could be done as an inpatient if she is admitted versus as an outpatient if she is discharged from the ED.    Dispo: To be determined based on ED evaluation.  Signed, Lonni Hanson, MD

## 2023-12-27 NOTE — H&P (Signed)
 [place]   PATIENT NAME: Holly Harrington    MR#:  [id number]  DATE OF BIRTH:  [birth year]  DATE OF ADMISSION:  12/27/2023  PRIMARY CARE PHYSICIAN: [facility]   Patient is coming from: Home  REQUESTING/REFERRING PHYSICIAN: Willo Dunnings, MD  CHIEF COMPLAINT:   Chief Complaint  Patient presents with   Shortness of Breath    HISTORY OF PRESENT ILLNESS:  Holly Harrington is a 78 y.o. female with medical history significant for asthma, essential hypertension, valvular heart disease, OSA on CPAP and PSVT, who presented to the emergency room with acute onset of worsening dyspnea with intermittent cough mainly at night without wheezing.  She denies any nausea or vomiting or abdominal pain.  She denies any palpitations.  She was seen today at Dr. Ulysses office due to elevated blood pressure that was 163-188/94-109 and generalized fatigue and malaise.  She has been feeling so weak lately it was hard for her to get out of bed.  She was found to be in atrial fibrillation that is apparently newly diagnosed.  ED Course: When the patient came to the emergency room, BP was 150/86 with otherwise normal vital signs.  Labs revealed.  Labs revealed CO2 of 19 and high sensitive troponin I was 689 and BNP 168.  CBC showed mild hemoconcentration.  Coag profile was normal. EKG as reviewed by me : EKG showed atrial fibrillation with controlled ventricular sponsor of 88 and Q waves septally. Imaging: 2 view chest x-ray showed the following: ll-defined right lower lobe airspace opacity is noted concerning for pneumonia. Followup PA and lateral chest X-ray is recommended in 3-4 weeks following trial of antibiotic therapy to ensure resolution and exclude underlying malignancy.  Chest CT revealed right middle lobe pneumonia with no evidence for PE.  The patient was placed on IV heparin  with bolus and drip and was given 4 baby aspirin , IV Rocephin  and Zithromax .  She will be admitted to a cardiac  telemetry bed for further evaluation and management. PAST MEDICAL HISTORY:   Past Medical History:  Diagnosis Date   Asthma    Breast lump    Corneal ulcer    Fibroid    Hypertension    Obstructive sleep apnea    Osteopenia    Osteoporosis    PSVT (paroxysmal supraventricular tachycardia) (HCC)    Valvular heart disease 01/2020   Moderate-severe mitral regurgitation; mild aortic regurgitation    PAST SURGICAL HISTORY:   Past Surgical History:  Procedure Laterality Date   ANTERIOR AND POSTERIOR VAGINAL REPAIR     VAGINAL HYSTERECTOMY      SOCIAL HISTORY:   Social History   Tobacco Use   Smoking status: Never   Smokeless tobacco: Never  Substance Use Topics   Alcohol use: Not Currently    Comment: O'Doul's occasionally    FAMILY HISTORY:   Family History  Problem Relation Age of Onset   Breast cancer Mother 27   Hypertension Brother    Stroke Brother    Hypertension Brother    Breast cancer Maternal Aunt 36    DRUG ALLERGIES:   Allergies  Allergen Reactions   Codeine Nausea And Vomiting, Dermatitis and Other (See Comments)    Other Reaction(s): Other (See Comments)   Hydrocodone    Oxycodone     REVIEW OF SYSTEMS:   ROS As per history of present illness. All pertinent systems were reviewed above. Constitutional, HEENT, cardiovascular, respiratory, GI, GU, musculoskeletal, neuro, psychiatric, endocrine, integumentary and  hematologic systems were reviewed and are otherwise negative/unremarkable except for positive findings mentioned above in the HPI.   MEDICATIONS AT HOME:   Prior to Admission medications   Medication Sig Start Date End Date Taking? Authorizing Provider  albuterol  (VENTOLIN  HFA) 108 (90 Base) MCG/ACT inhaler Inhale into the lungs every 6 (six) hours as needed for wheezing or shortness of breath.   Yes [provider]  ARNUITY ELLIPTA 100 MCG/ACT AEPB Inhale 1 puff into the lungs daily.   Yes [provider]   azelastine (ASTELIN) 0.1 % nasal spray Place 1 spray into both nostrils 2 (two) times daily. Patient taking differently: Place 1 spray into both nostrils as needed. 03/21/22  Yes [provider]  Cholecalciferol  (VITAMIN D -3 PO) Take 1 capsule by mouth every evening.   Yes [provider]  clobetasol ointment (TEMOVATE) 0.05 % Apply 1 Application topically as needed.   Yes [provider]  diltiazem  (CARDIZEM ) 30 MG tablet Take 1 tablet (30 mg total) by mouth 4 (four) times daily as needed (racing heart rate or palpitations). 05/10/23 12/27/23 Yes End, Lonni, MD  losartan  (COZAAR ) 100 MG tablet Take 1 tablet (100 mg total) by mouth daily. 02/02/23  Yes End, Lonni, MD  naproxen  sodium (ALEVE ) 220 MG tablet Take 220 mg by mouth as needed.   Yes [provider]  PROLIA 60 MG/ML SOSY injection Inject 60 mg into the skin every 6 (six) months.   Yes [provider]  Vibegron (GEMTESA) 75 MG TABS Take 75 mg by mouth daily.   Yes [provider]      VITAL SIGNS:  Blood pressure (!) 155/65, pulse 79, temperature 97.6 F (36.4 C), temperature source Oral, resp. rate 19, SpO2 95%.  PHYSICAL EXAMINATION:  Physical Exam  GENERAL:  78 y.o.-year-old Caucasian female patient lying in the bed with no acute distress.  EYES: Pupils equal, round, reactive to light and accommodation. No scleral icterus. Extraocular muscles intact.  HEENT: Head atraumatic, normocephalic. Oropharynx and nasopharynx clear.  NECK:  Supple, no jugular venous distention. No thyroid enlargement, no tenderness.  LUNGS: Normal breath sounds bilaterally, no wheezing, rales,rhonchi or crepitation. No use of accessory muscles of respiration.  CARDIOVASCULAR: Irregularly irregular rhythm, S1, S2 normal. No murmurs, rubs, or gallops.  ABDOMEN: Soft, nondistended, nontender. Bowel sounds present. No organomegaly or mass.  EXTREMITIES: No pedal edema, cyanosis, or clubbing.   NEUROLOGIC: Cranial nerves II through XII are intact. Muscle strength 5/5 in all extremities. Sensation intact. Gait not checked.  PSYCHIATRIC: The patient is alert and oriented x 3.  Normal affect and good eye contact. SKIN: No obvious rash, lesion, or ulcer.   LABORATORY PANEL:   CBC Recent Labs  Lab 12/27/23 1729  WBC 10.2  HGB 14.1  HCT 49.3*  PLT 310   ------------------------------------------------------------------------------------------------------------------  Chemistries  Recent Labs  Lab 12/27/23 1729  NA 137  K 4.7  CL 104  CO2 19*  GLUCOSE 93  BUN 17  CREATININE 0.71  CALCIUM  9.6   ------------------------------------------------------------------------------------------------------------------  Cardiac Enzymes No results for input(s): TROPONINI in the last 168 hours. ------------------------------------------------------------------------------------------------------------------  RADIOLOGY:  CT Angio Chest PE W/Cm &/Or Wo Cm Result Date: 12/27/2023 CLINICAL DATA:  Increased breath sounds and difficulty breathing, initial encounter EXAM: CT ANGIOGRAPHY CHEST WITH CONTRAST TECHNIQUE: Multidetector CT imaging of the chest was performed using the standard protocol during bolus administration of intravenous contrast. Multiplanar CT image reconstructions and MIPs were obtained to evaluate the vascular anatomy. RADIATION DOSE REDUCTION: This  exam was performed according to the departmental dose-optimization program which includes automated exposure control, adjustment of the mA and/or kV according to patient size and/or use of iterative reconstruction technique. CONTRAST:  75mL OMNIPAQUE  IOHEXOL  350 MG/ML SOLN COMPARISON:  Chest x-ray from earlier in the same day. FINDINGS: Cardiovascular: Thoracic aorta shows atherosclerotic calcifications without aneurysmal dilatation or dissection. No cardiac enlargement is noted. The pulmonary artery shows a normal branching  pattern bilaterally. No intraluminal filling defect to suggest pulmonary embolism is noted. Mild coronary calcifications are seen. Mediastinum/Nodes: Thoracic inlet is within normal limits. No hilar or mediastinal adenopathy is noted. The esophagus as visualized is within normal limits. Lungs/Pleura: Lungs are well aerated bilaterally. Mosaic attenuation changes are noted throughout both lungs consistent with air trapping. Focal right middle lobe infiltrate is noted which corresponds to that seen on the prior exam. Some inspissated material is noted within the lower lobe bronchial tree. No sizable effusion is noted. Upper Abdomen: Visualized upper abdomen is within normal limits. Musculoskeletal: Degenerative changes of the thoracic spine are noted. Review of the MIP images confirms the above findings. IMPRESSION: No evidence of pulmonary emboli. Right middle lobe pneumonia. No other focal abnormality is seen. Electronically Signed   By: Oneil Devonshire M.D.   On: 12/27/2023 22:20   DG Chest 2 View Result Date: 12/27/2023 CLINICAL DATA:  Shortness of breath. EXAM: CHEST - 2 VIEW COMPARISON:  September 26, 2019. FINDINGS: Stable cardiomediastinal silhouette. Ill-defined airspace opacity is noted in right lower lobe concerning for pneumonia. Left lung is clear. Bony thorax is unremarkable. IMPRESSION: Ill-defined right lower lobe airspace opacity is noted concerning for pneumonia. Followup PA and lateral chest X-ray is recommended in 3-4 weeks following trial of antibiotic therapy to ensure resolution and exclude underlying malignancy. Electronically Signed   By: Lynwood Landy Raddle M.D.   On: 12/27/2023 18:19      IMPRESSION AND PLAN:  Assessment and Plan: * Sepsis due to pneumonia [facility]) - The patient will be admitted to a medical telemetry bed. - Will continue antibiotic therapy with IV Rocephin  and Zithromax . - Mucolytic therapy be provided as well as duo nebs q.i.d. and q.4 hours p.r.n. - We will follow blood  cultures. - Will continue hydration with IV lactated ringer .   NSTEMI (non-ST elevated myocardial infarction) (HCC) - This could be explaining the generalized weakness and partly the dyspnea. - There could be demand ischemia from her pneumonia and sepsis. - Will place her on high-dose statin therapy and beta-blocker therapy. - We will obtain 2D echo and cardiology consult. - [facility] group was notified about the patient.  Asthma, chronic - Will continue bronchodilator therapy while holding off any long-acting beta agonist in the setting of suspected MI.  Essential hypertension - Will continue antihypertensive therapy.   DVT prophylaxis: IV heparin . Advanced Care Planning:  Code Status: full code.  Family Communication:  The plan of care was discussed in details with the patient (and family). I answered all questions. The patient agreed to proceed with the above mentioned plan. Further management will depend upon hospital course. Disposition Plan: Back to previous home environment Consults called: Cardiology. All the records are reviewed and case discussed with ED provider.  Status is: Inpatient  At the time of the admission, it appears that the appropriate admission status for this patient is inpatient.  This is judged to be reasonable and necessary in order to provide the required intensity of service to ensure the patient's safety given the presenting symptoms, physical exam  findings and initial radiographic and laboratory data in the context of comorbid conditions.  The patient requires inpatient status due to high intensity of service, high risk of further deterioration and high frequency of surveillance required.  I certify that at the time of admission, it is my clinical judgment that the patient will require inpatient hospital care extending more than 2 midnights.                            Dispo: The patient is from: Home              Anticipated d/c is to: Home               Patient currently is not medically stable to d/c.              Difficult to place patient: No  [place]ison DELENA Peaches M.D on 12/28/2023 at 1:50 AM  [facility]   From 7 PM-7 AM, contact night-coverage [email or web address]  CC: Primary care physician; [facility]

## 2023-12-27 NOTE — Telephone Encounter (Signed)
  Pt c/o BP issue: STAT if pt c/o blurred vision, one-sided weakness or slurred speech.  STAT if BP is GREATER than 180/120 TODAY.  STAT if BP is LESS than 90/60 and SYMPTOMATIC TODAY  1. What is your BP concern? BP elevated   2. Have you taken any BP medication today? yes   3. What are your last 5 BP readings? Couple of weeks 200/80 Today 188/97 110, 163/109 89, 173/94 107  4. Are you having any other symptoms (ex. Dizziness, headache, blurred vision, passed out)? Not feeling good and have some SOB, feels shaky and no energy to do anything   Patient said he BP has been elevated and would like to know if she can take additional losartan  to help bring down her BP

## 2023-12-27 NOTE — Telephone Encounter (Signed)
 Called the patient back. She has agreed to come in today to see Dr. Mady. She will have a friend drive her. Appointment made for 4:20 pm

## 2023-12-27 NOTE — ED Notes (Signed)
 Date and time results received: 12/27/23 2135  Test: troponin Critical Value: 689  Name of Provider Notified: Dr. Willo  Orders Received? Or Actions Taken?: orders pending

## 2023-12-27 NOTE — ED Provider Notes (Signed)
 [facility] Provider Note    Event Date/Time   First MD Initiated Contact with Patient 12/27/23 1926     (approximate)   History   Chief Complaint Shortness of Breath   HPI  Holly Harrington is a 78 y.o. female with a past medical history of hypertension, SVT, and asthma who presents to the ED complaining of shortness of breath.  Patient reports that she has been feeling generally fatigued with exertion and increasing difficulty breathing over the past month.  She tested positive for COVID-19 about 2 weeks ago, had a cough at that time, but this has since improved.  She has continued to feel short of breath and her difficulty breathing seemed acutely worse while she was out in the garden today.  She denies any associated chest pain, does report some dizziness and lightheadedness.  She went to see her cardiologist, who found her to be in atrial fibrillation.  Due to ongoing difficulty breathing, she was referred to the ED for further evaluation.  Patient has not noticed any pain or swelling in her legs, denies any cardiac history other than SVT.     Physical Exam   Triage Vital Signs: ED Triage Vitals  Encounter Vitals Group     BP 12/27/23 1727 (!) 158/100     Girls Systolic BP Percentile --      Girls Diastolic BP Percentile --      Boys Systolic BP Percentile --      Boys Diastolic BP Percentile --      Pulse Rate 12/27/23 1727 97     Resp 12/27/23 1727 16     Temp 12/27/23 1728 98.1 F (36.7 C)     Temp Source 12/27/23 1728 Oral     SpO2 12/27/23 1727 97 %     Weight --      Height --      Head Circumference --      Peak Flow --      Pain Score --      Pain Loc --      Pain Education --      Exclude from Growth Chart --     Most recent vital signs: Vitals:   12/27/23 2030 12/27/23 2230  BP: (!) 195/83 (!) 156/81  Pulse: 88 87  Resp: 18 (!) 21  Temp:    SpO2: 97% 96%    Constitutional: Alert and oriented. Eyes: Conjunctivae are  normal. Head: Atraumatic. Nose: No congestion/rhinnorhea. Mouth/Throat: Mucous membranes are moist.  Cardiovascular: Normal rate, irregularly irregular rhythm. Grossly normal heart sounds.  2+ radial pulses bilaterally. Respiratory: Normal respiratory effort.  No retractions. Lungs CTAB. Gastrointestinal: Soft and nontender. No distention. Musculoskeletal: No lower extremity tenderness nor edema.  Neurologic:  Normal speech and language. No gross focal neurologic deficits are appreciated.    ED Results / Procedures / Treatments   Labs (all labs ordered are listed, but only abnormal results are displayed) Labs Reviewed  BASIC METABOLIC PANEL WITH GFR - Abnormal; Notable for the following components:      Result Value   CO2 19 (*)    All other components within normal limits  CBC - Abnormal; Notable for the following components:   HCT 49.3 (*)    MCHC 28.6 (*)    All other components within normal limits  BRAIN NATRIURETIC PEPTIDE - Abnormal; Notable for the following components:   B Natriuretic Peptide 168.0 (*)    All other components within normal limits  TROPONIN I (HIGH SENSITIVITY) - Abnormal; Notable for the following components:   Troponin I (High Sensitivity) 689 (*)    All other components within normal limits  HEPARIN  LEVEL (UNFRACTIONATED)  CBC  PROTIME-INR  APTT  HEPARIN  LEVEL (UNFRACTIONATED)     EKG  ED ECG REPORT I, Carlin Palin, the attending physician, personally viewed and interpreted this ECG.   Date: 12/27/2023  EKG Time: 17:33  Rate: 91  Rhythm: atrial fibrillation  Axis: LAD  Intervals:none  ST&T Change: None  RADIOLOGY Chest x-ray reviewed and interpreted by me with right lower lobe infiltrate, no edema or effusion noted.  PROCEDURES:  Critical Care performed: Yes  .Critical Care  Performed by: Palin Carlin, MD Authorized by: Palin Carlin, MD   Critical care provider statement:    Critical care time (minutes):  30    Critical care time was exclusive of:  Separately billable procedures and treating other patients and teaching time   Critical care was necessary to treat or prevent imminent or life-threatening deterioration of the following conditions:  Cardiac failure   Critical care was time spent personally by me on the following activities:  Development of treatment plan with patient or surrogate, discussions with consultants, evaluation of patient's response to treatment, examination of patient, ordering and review of laboratory studies, ordering and review of radiographic studies, ordering and performing treatments and interventions, pulse oximetry, re-evaluation of patient's condition and review of old charts   I assumed direction of critical care for this patient from another provider in my specialty: no     Care discussed with: admitting provider      MEDICATIONS ORDERED IN ED: Medications  heparin  ADULT infusion 100 units/mL (25000 units/250mL) (800 Units/hr Intravenous New Bag/Given 12/27/23 2230)  cefTRIAXone  (ROCEPHIN ) 1 g in sodium chloride  0.9 % 100 mL IVPB (0 g Intravenous Stopped 12/27/23 2047)  azithromycin  (ZITHROMAX ) 500 mg in sodium chloride  0.9 % 250 mL IVPB (0 mg Intravenous Stopped 12/27/23 2220)  aspirin  chewable tablet 324 mg (324 mg Oral Given 12/27/23 2221)  iohexol  (OMNIPAQUE ) 350 MG/ML injection 75 mL (75 mLs Intravenous Contrast Given 12/27/23 2154)  heparin  bolus via infusion 4,000 Units (4,000 Units Intravenous Bolus from Bag 12/27/23 2231)     IMPRESSION / MDM / ASSESSMENT AND PLAN / ED COURSE  I reviewed the triage vital signs and the nursing notes.                              78 y.o. female with past medical history of hypertension, SVT, and asthma who presents to the ED with increasing difficulty breathing over the past month with some dizziness and lightheadedness today.  Patient's presentation is most consistent with acute presentation with potential threat to life or  bodily function.  Differential diagnosis includes, but is not limited to, arrhythmia, ACS, PE, pneumonia, CHF, anemia, electrolyte abnormality, AKI.  Patient nontoxic-appearing and in no acute distress, vital signs remarkable for hypertension but otherwise reassuring.  EKG shows atrial fibrillation with controlled rate, no ischemic changes noted.  Chest x-ray concerning for right lower lobe pneumonia, no pulmonary edema noted.  She is not in any respiratory distress and maintaining oxygen saturations at 97% on room air.  Labs thus far without significant anemia, leukocytosis, electrolyte abnormality, or AKI.  We will add on troponin and BNP, reassess following dose of IV antibiotics for pneumonia.  BNP within normal limits but troponin significantly elevated at 689.  This is  higher than I would expect for atrial fibrillation alone, especially given controlled rate.  CTA chest performed and negative for PE, will start patient on heparin  for A-fib and NSTEMI.  She was also given loading dose of aspirin , continues to deny chest pain.  Case discussed with hospitalist for admission.      FINAL CLINICAL IMPRESSION(S) / ED DIAGNOSES   Final diagnoses:  Atrial fibrillation, unspecified type (HCC)  Pneumonia of right middle lobe due to infectious organism  NSTEMI (non-ST elevated myocardial infarction) (HCC)     Rx / DC Orders   ED Discharge Orders     None        Note:  This document was prepared using Dragon voice recognition software and may include unintentional dictation errors.   Willo Dunnings, MD 12/27/23 [phone]

## 2023-12-28 ENCOUNTER — Inpatient Hospital Stay
Admit: 2023-12-28 | Discharge: 2023-12-28 | Disposition: A | Discharge status: Home / Self Care | Attending: Cardiovascular Disease | Admitting: Cardiovascular Disease

## 2023-12-28 ENCOUNTER — Encounter: Payer: Self-pay | Admitting: Family Medicine

## 2023-12-28 DIAGNOSIS — A419 Sepsis, unspecified organism: Secondary | ICD-10-CM | POA: Diagnosis not present

## 2023-12-28 DIAGNOSIS — I4891 Unspecified atrial fibrillation: Secondary | ICD-10-CM | POA: Diagnosis not present

## 2023-12-28 DIAGNOSIS — R7989 Other specified abnormal findings of blood chemistry: Secondary | ICD-10-CM

## 2023-12-28 DIAGNOSIS — I10 Essential (primary) hypertension: Secondary | ICD-10-CM | POA: Diagnosis not present

## 2023-12-28 DIAGNOSIS — J189 Pneumonia, unspecified organism: Secondary | ICD-10-CM | POA: Diagnosis not present

## 2023-12-28 DIAGNOSIS — J45909 Unspecified asthma, uncomplicated: Secondary | ICD-10-CM | POA: Insufficient documentation

## 2023-12-28 DIAGNOSIS — I214 Non-ST elevation (NSTEMI) myocardial infarction: Secondary | ICD-10-CM

## 2023-12-28 LAB — ECHOCARDIOGRAM COMPLETE
AR max vel: 3.15 cm2
AV Area VTI: 3.65 cm2
AV Area mean vel: 2.96 cm2
AV Mean grad: 2 mmHg
AV Peak grad: 3.3 mmHg
Ao pk vel: 0.92 m/s
Area-P 1/2: 3.79 cm2
Height: 62.5 in
S' Lateral: 2.5 cm
Weight: 2736 [oz_av]

## 2023-12-28 LAB — BASIC METABOLIC PANEL WITH GFR
Anion gap: 10 (ref 5–15)
BUN: 12 mg/dL (ref 8–23)
CO2: 24 mmol/L (ref 22–32)
Calcium: 8.9 mg/dL (ref 8.9–10.3)
Chloride: 104 mmol/L (ref 98–111)
Creatinine, Ser: 0.61 mg/dL (ref 0.44–1.00)
GFR, Estimated: >60 mL/min (ref >60)
Glucose, Bld: 106 mg/dL — ABNORMAL HIGH (ref 70–99)
Potassium: 3.9 mmol/L (ref 3.5–5.1)
Sodium: 138 mmol/L (ref 135–145)

## 2023-12-28 LAB — PROTIME-INR
INR: 1.1 (ref 0.8–1.2)
INR: 1.1 (ref 0.8–1.2)
Prothrombin Time: 14.6 s (ref 11.4–15.2)
Prothrombin Time: 14.3 s (ref 11.4–15.2)

## 2023-12-28 LAB — APTT: aPTT: 31 s (ref 24–36)

## 2023-12-28 LAB — MAGNESIUM: Magnesium: 2 mg/dL (ref 1.7–2.4)

## 2023-12-28 LAB — CBC
HCT: 38.5 % (ref 36.0–46.0)
Hemoglobin: 12.1 g/dL (ref 12.0–15.0)
MCH: 27.9 pg (ref 26.0–34.0)
MCHC: 31.4 g/dL (ref 30.0–36.0)
MCV: 88.9 fL (ref 80.0–100.0)
Platelets: 278 K/uL (ref 150–400)
RBC: 4.33 MIL/uL (ref 3.87–5.11)
RDW: 14 % (ref 11.5–15.5)
WBC: 8.9 K/uL (ref 4.0–10.5)
nRBC: 0 % (ref 0.0–0.2)

## 2023-12-28 LAB — HEPARIN LEVEL (UNFRACTIONATED)
Heparin Unfractionated: <0.1 [IU]/mL — ABNORMAL LOW (ref 0.30–0.70)
Heparin Unfractionated: <0.1 [IU]/mL — ABNORMAL LOW (ref 0.30–0.70)
Heparin Unfractionated: 0.11 [IU]/mL — ABNORMAL LOW (ref 0.30–0.70)

## 2023-12-28 LAB — CORTISOL-AM, BLOOD: Cortisol - AM: 14.8 ug/dL (ref 6.7–22.6)

## 2023-12-28 LAB — TSH: TSH: 2.012 u[IU]/mL (ref 0.350–4.500)

## 2023-12-28 LAB — TROPONIN I (HIGH SENSITIVITY): Troponin I (High Sensitivity): 813 ng/L (ref <18)

## 2023-12-28 MED ORDER — DILTIAZEM HCL ER COATED BEADS 120 MG PO CP24
120.0000 mg | ORAL_CAPSULE | Freq: Every day | ORAL | Status: DC
Start: 2023-12-28 — End: 2023-12-30
  Administered 2023-12-28 – 2023-12-30 (×3): 120 mg via ORAL
  Filled 2023-12-28 (×3): qty 1

## 2023-12-28 MED ORDER — HEPARIN BOLUS VIA INFUSION
2000.0000 [IU] | Freq: Once | INTRAVENOUS | Status: AC
Start: 2023-12-28 — End: 2023-12-28
  Administered 2023-12-28: 2000 [IU] via INTRAVENOUS
  Filled 2023-12-28: qty 2000

## 2023-12-28 MED ORDER — HEPARIN BOLUS VIA INFUSION
4000.0000 [IU] | Freq: Once | INTRAVENOUS | Status: AC
Start: 2023-12-28 — End: 2023-12-28
  Administered 2023-12-28: 4000 [IU] via INTRAVENOUS
  Filled 2023-12-28: qty 4000

## 2023-12-28 MED ORDER — METOPROLOL TARTRATE 25 MG PO TABS
25.0000 mg | ORAL_TABLET | Freq: Two times a day (BID) | ORAL | Status: DC
  Administered 2023-12-28 (×2): 25 mg via ORAL
  Filled 2023-12-28 (×2): qty 1

## 2023-12-28 MED ORDER — METOPROLOL TARTRATE 25 MG PO TABS
12.5000 mg | ORAL_TABLET | Freq: Two times a day (BID) | ORAL | Status: DC
Start: 2023-12-28 — End: 2023-12-28
  Administered 2023-12-28: 12.5 mg via ORAL
  Filled 2023-12-28: qty 1

## 2023-12-28 NOTE — Assessment & Plan Note (Signed)
-   Will continue antihypertensive therapy.

## 2023-12-28 NOTE — Progress Notes (Signed)
 [facility]  - [place] [facility]   PATIENT NAME: Holly Harrington    MR#:  [id number]  DATE OF BIRTH:  12/08/1945  SUBJECTIVE:  no family at bedside. Patient said she will call and discussed with her son. Came in with generalized weakness and found evaluated blood pressure at home called cardiology office will redirected patient to come to the ER found to have new onset a fib with RVR. Patient received treatment currently heartrate is in the 70s on heparin  drip. She has been having some cough mild shortness of breath and was diagnosed with COVID few days ago. Denies any chest pain.    VITALS:  Blood pressure (!) 138/117, pulse 93, temperature 98 F (36.7 C), temperature source Oral, resp. rate 14, height 5' 2.5 (1.588 m), weight 77.6 kg, SpO2 94%.  PHYSICAL EXAMINATION:   GENERAL:  78 y.o.-year-old patient with no acute distress.  LUNGS: Normal breath sounds bilaterally, no wheezing CARDIOVASCULAR: S1, S2 normal. No murmur irregular rhythm ABDOMEN: Soft, nontender, nondistended. Bowel sounds present.  EXTREMITIES: No  edema b/l.    NEUROLOGIC: nonfocal  patient is alert and awake, weak SKIN: No obvious rash, lesion, or ulcer.   LABORATORY PANEL:  CBC Recent Labs  Lab 12/28/23 0614  WBC 8.9  HGB 12.1  HCT 38.5  PLT 278    Chemistries  Recent Labs  Lab 12/28/23 0614  NA 138  K 3.9  CL 104  CO2 24  GLUCOSE 106*  BUN 12  CREATININE 0.61  CALCIUM  8.9  MG 2.0   Cardiac Enzymes No results for input(s): TROPONINI in the last 168 hours. RADIOLOGY:  ECHOCARDIOGRAM COMPLETE Result Date: 12/28/2023    ECHOCARDIOGRAM REPORT   Patient Name:   Holly Harrington Date of Exam: 12/28/2023 Medical Rec #:  [id number]        Height:       62.5 in Accession #:    [id number]       Weight:       171.0 lb Date of Birth:  03/20/1946         BSA:          1.799 m Patient Age:    78 years         BP:           170/82 mmHg Patient Gender: F                HR:           89  bpm. Exam Location:  [facility] Procedure: 2D Echo, Cardiac Doppler and Color Doppler (Both Spectral and Color            Flow Doppler were utilized during procedure). Indications:     Elevated troponin  History:         Patient has prior history of Echocardiogram examinations, most                  recent 01/19/2023. Risk Factors:Hypertension. Valvular heart                  disease.  Sonographer:     Christopher Furnace Referring Phys:  [id number] EVALENE JINNY LUNGER Diagnosing Phys: EVALENE LUNGER MD IMPRESSIONS  1. Left ventricular ejection fraction, by estimation, is 60 to 65%. The left ventricle has normal function. The left ventricle has no regional wall motion abnormalities. There is mild left ventricular hypertrophy. Left ventricular diastolic parameters are indeterminate.  2. Right ventricular systolic function is normal. The  right ventricular size is normal.  3. Left atrial size was mild to moderately dilated.  4. The mitral valve is normal in structure. Mild mitral valve regurgitation. No evidence of mitral stenosis. Moderate mitral annular calcification.  5. The aortic valve is normal in structure. Aortic valve regurgitation is mild. Aortic valve sclerosis is present, with no evidence of aortic valve stenosis.  6. The inferior vena cava is normal in size with greater than 50% respiratory variability, suggesting right atrial pressure of 3 mmHg. FINDINGS  Left Ventricle: Left ventricular ejection fraction, by estimation, is 60 to 65%. The left ventricle has normal function. The left ventricle has no regional wall motion abnormalities. Strain was performed and the global longitudinal strain is indeterminate. The left ventricular internal cavity size was normal in size. There is mild left ventricular hypertrophy. Left ventricular diastolic parameters are indeterminate. Right Ventricle: The right ventricular size is normal. No increase in right ventricular wall thickness. Right ventricular systolic function is normal. Left Atrium:  Left atrial size was mild to moderately dilated. Right Atrium: Right atrial size was normal in size. Pericardium: There is no evidence of pericardial effusion. Mitral Valve: The mitral valve is normal in structure. Moderate mitral annular calcification. Mild mitral valve regurgitation. No evidence of mitral valve stenosis. Tricuspid Valve: The tricuspid valve is normal in structure. Tricuspid valve regurgitation is not demonstrated. No evidence of tricuspid stenosis. Aortic Valve: The aortic valve is normal in structure. Aortic valve regurgitation is mild. Aortic valve sclerosis is present, with no evidence of aortic valve stenosis. Aortic valve mean gradient measures 2.0 mmHg. Aortic valve peak gradient measures 3.3  mmHg. Aortic valve area, by VTI measures 3.65 cm. Pulmonic Valve: The pulmonic valve was normal in structure. Pulmonic valve regurgitation is not visualized. No evidence of pulmonic stenosis. Aorta: The aortic root is normal in size and structure. Venous: The inferior vena cava is normal in size with greater than 50% respiratory variability, suggesting right atrial pressure of 3 mmHg. IAS/Shunts: No atrial level shunt detected by color flow Doppler. Additional Comments: 3D was performed not requiring image post processing on an independent workstation and was indeterminate.  LEFT VENTRICLE PLAX 2D LVIDd:         4.80 cm LVIDs:         2.50 cm LV PW:         1.40 cm LV IVS:        1.00 cm LVOT diam:     2.00 cm LV SV:         61 LV SV Index:   34 LVOT Area:     3.14 cm  RIGHT VENTRICLE RV Basal diam:  3.50 cm RV Mid diam:    3.10 cm LEFT ATRIUM             Index        RIGHT ATRIUM           Index LA diam:        4.10 cm 2.28 cm/m   RA Area:     17.20 cm LA Vol (A2C):   95.2 ml 52.91 ml/m  RA Volume:   43.80 ml  24.34 ml/m LA Vol (A4C):   76.0 ml 42.24 ml/m LA Biplane Vol: 86.5 ml 48.08 ml/m  AORTIC VALVE AV Area (Vmax):    3.15 cm AV Area (Vmean):   2.96 cm AV Area (VTI):     3.65 cm AV  Vmax:  91.50 cm/s AV Vmean:          67.150 cm/s AV VTI:            0.168 m AV Peak Grad:      3.3 mmHg AV Mean Grad:      2.0 mmHg LVOT Vmax:         91.80 cm/s LVOT Vmean:        63.200 cm/s LVOT VTI:          0.195 m LVOT/AV VTI ratio: 1.16  AORTA Ao Root diam: 2.70 cm MITRAL VALVE                TRICUSPID VALVE MV Area (PHT): 3.79 cm     TR Peak grad:   42.0 mmHg MV Decel Time: 200 msec     TR Vmax:        324.00 cm/s MV E velocity: 144.00 cm/s                             SHUNTS                             Systemic VTI:  0.20 m                             Systemic Diam: 2.00 cm Evalene Lunger MD Electronically signed by Evalene Lunger MD Signature Date/Time: 12/28/2023/2:57:17 PM    Final    CT Angio Chest PE W/Cm &/Or Wo Cm Result Date: 12/27/2023 CLINICAL DATA:  Increased breath sounds and difficulty breathing, initial encounter EXAM: CT ANGIOGRAPHY CHEST WITH CONTRAST TECHNIQUE: Multidetector CT imaging of the chest was performed using the standard protocol during bolus administration of intravenous contrast. Multiplanar CT image reconstructions and MIPs were obtained to evaluate the vascular anatomy. RADIATION DOSE REDUCTION: This exam was performed according to the departmental dose-optimization program which includes automated exposure control, adjustment of the mA and/or kV according to patient size and/or use of iterative reconstruction technique. CONTRAST:  75mL OMNIPAQUE  IOHEXOL  350 MG/ML SOLN COMPARISON:  Chest x-ray from earlier in the same day. FINDINGS: Cardiovascular: Thoracic aorta shows atherosclerotic calcifications without aneurysmal dilatation or dissection. No cardiac enlargement is noted. The pulmonary artery shows a normal branching pattern bilaterally. No intraluminal filling defect to suggest pulmonary embolism is noted. Mild coronary calcifications are seen. Mediastinum/Nodes: Thoracic inlet is within normal limits. No hilar or mediastinal adenopathy is noted. The esophagus  as visualized is within normal limits. Lungs/Pleura: Lungs are well aerated bilaterally. Mosaic attenuation changes are noted throughout both lungs consistent with air trapping. Focal right middle lobe infiltrate is noted which corresponds to that seen on the prior exam. Some inspissated material is noted within the lower lobe bronchial tree. No sizable effusion is noted. Upper Abdomen: Visualized upper abdomen is within normal limits. Musculoskeletal: Degenerative changes of the thoracic spine are noted. Review of the MIP images confirms the above findings. IMPRESSION: No evidence of pulmonary emboli. Right middle lobe pneumonia. No other focal abnormality is seen. Electronically Signed   By: Oneil Devonshire M.D.   On: 12/27/2023 22:20   DG Chest 2 View Result Date: 12/27/2023 CLINICAL DATA:  Shortness of breath. EXAM: CHEST - 2 VIEW COMPARISON:  September 26, 2019. FINDINGS: Stable cardiomediastinal silhouette. Ill-defined airspace opacity is noted in right lower lobe concerning for pneumonia. Left lung is clear. Bony  thorax is unremarkable. IMPRESSION: Ill-defined right lower lobe airspace opacity is noted concerning for pneumonia. Followup PA and lateral chest X-ray is recommended in 3-4 weeks following trial of antibiotic therapy to ensure resolution and exclude underlying malignancy. Electronically Signed   By: Lynwood Landy Raddle M.D.   On: 12/27/2023 18:19    Assessment and Plan  CHERIL SLATTERY is a 78 y.o. female with a past medical history of hypertension, SVT, and asthma who presents to the ED complaining of shortness of breath.  Patient reports that she has been feeling generally fatigued with exertion and increasing difficulty breathing over the past month.  She tested positive for COVID-19 about 2 weeks ago, had a cough at that time, but this has since improved.  She has continued to feel short of breath and her difficulty breathing seemed acutely worse while she was out in the garden today.   She went  to see her cardiologist, who found her to be in atrial fibrillation. Due to ongoing difficulty breathing, she was referred to the ED for further evaluation.   Chest CT revealed right middle lobe pneumonia with no evidence for PE.   Sepsis due to right middle lobe pneumonia (HCC) history of COVID two weeks ago - continue  IV Rocephin  and Zithromax . - Mucolytic therapy be provided as well as duo nebs q.i.d. and q.4 hours p.r.n. - blood cultures so far negative -- received IV fluids with lactated ringer    new onset a fib with RVR -- likely in the setting of pneumonia in recent COVID and less -- currently on IV heparin  drip -- continue beta-blockers -- await cardiology recommendation -- will transition heparin  toDOAC prior to discharge per cardiology   elevated troponin appears demand ischemia in the setting of sepsis  - cardiology consultation with Dr. Gollan -- troponin 680--- 813 -- continue IV heparin  drip  Asthma, chronic - prn nebs   Essential hypertension -  continue antihypertensive therapy.  Procedures: Family communication :none-- patient reports will inform some Consults : cardiology CODE STATUS: full DVT Prophylaxis : heparin  drip Level of care: Telemetry Cardiac Status is: Inpatient Remains inpatient appropriate because: Sepsis    TOTAL TIME TAKING CARE OF THIS PATIENT: 40 minutes.  >50% time spent on counselling and coordination of care  Note: This dictation was prepared with Dragon dictation along with smaller phrase technology. Any transcriptional errors that result from this process are unintentional.  Leita Blanch M.D    [facility]   CC: Primary care physician; Care, [facility]

## 2023-12-28 NOTE — Assessment & Plan Note (Signed)
-   This could be explaining the generalized weakness and partly the dyspnea. - There could be demand ischemia from her pneumonia and sepsis. - Will place her on high-dose statin therapy and beta-blocker therapy. - We will obtain 2D echo and cardiology consult. - [facility] group was notified about the patient.

## 2023-12-28 NOTE — Assessment & Plan Note (Addendum)
-   Will continue bronchodilator therapy while holding off any long-acting beta agonist in the setting of suspected MI.

## 2023-12-28 NOTE — Assessment & Plan Note (Addendum)
-   The patient will be admitted to a medical telemetry bed. - Will continue antibiotic therapy with IV Rocephin  and Zithromax . - Mucolytic therapy be provided as well as duo nebs q.i.d. and q.4 hours p.r.n. - We will follow blood cultures. - Will continue hydration with IV lactated ringer .

## 2023-12-28 NOTE — Consult Note (Signed)
 Pharmacy Consult Note - Anticoagulation  Pharmacy Consult for Heparin  Indication: chest pain/ACS Allergies  Allergen Reactions   Codeine Nausea And Vomiting, Dermatitis and Other (See Comments)    Other Reaction(s): Other (See Comments)   Hydrocodone    Oxycodone     PATIENT MEASUREMENTS: Height: 5' 2.5 (158.8 cm) Weight: 77.6 kg (171 lb) IBW/kg (Calculated) : 51.25 HEPARIN  DW (KG): 68.1  VITAL SIGNS: Temp: 98.2 F (36.8 C) (09/18 1709) Temp Source: Oral (09/18 1709) BP: 130/52 (09/18 1709) Pulse Rate: 89 (09/18 1709)  Recent Labs    12/27/23 2221 12/28/23 0614 12/28/23 0759 12/28/23 1758  HGB  --  12.1  --   --   HCT  --  38.5  --   --   PLT  --  278  --   --   APTT 31  --   --   --   LABPROT 14.6 14.3  --   --   INR 1.1 1.1  --   --   HEPARINUNFRC <0.10* <0.10*  --  0.11*  CREATININE  --  0.61  --   --   TROPONINIHS  --   --  813*  --     Estimated Creatinine Clearance: 54.7 mL/min (by C-G formula based on SCr of 0.61 mg/dL).  PAST MEDICAL HISTORY: Past Medical History:  Diagnosis Date   Asthma    Breast lump    Corneal ulcer    Fibroid    Hypertension    Obstructive sleep apnea    Osteopenia    Osteoporosis    PSVT (paroxysmal supraventricular tachycardia) (HCC)    Valvular heart disease 01/2020   Moderate-severe mitral regurgitation; mild aortic regurgitation    Medications:  Medications Prior to Admission  Medication Sig Dispense Refill Last Dose/Taking   albuterol  (VENTOLIN  HFA) 108 (90 Base) MCG/ACT inhaler Inhale into the lungs every 6 (six) hours as needed for wheezing or shortness of breath.   Unknown   ARNUITY ELLIPTA 100 MCG/ACT AEPB Inhale 1 puff into the lungs daily.   Unknown   azelastine (ASTELIN) 0.1 % nasal spray Place 1 spray into both nostrils 2 (two) times daily. (Patient taking differently: Place 1 spray into both nostrils as needed.)   Unknown   Cholecalciferol  (VITAMIN D -3 PO) Take 1 capsule by mouth every evening.   12/26/2023  Evening   clobetasol ointment (TEMOVATE) 0.05 % Apply 1 Application topically as needed.   Unknown   diltiazem  (CARDIZEM ) 30 MG tablet Take 1 tablet (30 mg total) by mouth 4 (four) times daily as needed (racing heart rate or palpitations). 120 tablet 5 12/27/2023 Morning   losartan  (COZAAR ) 100 MG tablet Take 1 tablet (100 mg total) by mouth daily. 90 tablet 3 12/26/2023 at  7:30 PM   naproxen  sodium (ALEVE ) 220 MG tablet Take 220 mg by mouth as needed.   Unknown   PROLIA 60 MG/ML SOSY injection Inject 60 mg into the skin every 6 (six) months.   09/04/2023   Vibegron (GEMTESA) 75 MG TABS Take 75 mg by mouth daily.   12/26/2023 Evening   Scheduled:   atorvastatin   80 mg Oral QHS   cholecalciferol   1,000 Units Oral QPM   diltiazem   120 mg Oral Daily   guaiFENesin   600 mg Oral BID   ipratropium-albuterol   3 mL Nebulization QID   losartan   100 mg Oral QHS   metoprolol  tartrate  25 mg Oral BID   Infusions:   azithromycin      cefTRIAXone  (ROCEPHIN )  IV     heparin  1,000 Units/hr (12/28/23 9062)   PRN:   ASSESSMENT: 78 y.o. female with PMH SVT is presenting with SOB. Patient is not on chronic anticoagulation per chart review. Pharmacy has been consulted to initiate and manage heparin  intravenous infusion.  09/18 0614 HL <0.10, subtherapeutic at 800 u/hr  Goal(s) of therapy: Heparin  level 0.3 - 0.7 units/mL aPTT 66 - 102 seconds Monitor platelets by anticoagulation protocol: Yes   Baseline anticoagulation labs: Recent Labs    12/27/23 1729 12/27/23 2221 12/28/23 0614  APTT  --  31  --   INR  --  1.1 1.1  HGB 14.1  --  12.1  PLT 310  --  278       PLAN: Heparin  level is subtherapeutic. Will give heparin  bolus of 4000 units x 1 and increase heparin  infusion to 1200 units/hr. Recheck heparin  level in 8 hours. CBC daily while on heparin .   Cathaleen GORMAN Blanch, PharmD Clinical Pharmacist 12/28/2023 6:57 PM

## 2023-12-28 NOTE — Consult Note (Signed)
 Cardiology Consultation:   Patient ID: Holly Harrington; [id number]; 29-Nov-1945   Admit date: 12/27/2023 Date of Consult: 12/28/2023  Primary Care Provider: [facility] Primary Cardiologist: End Primary Electrophysiologist:  None   Patient Profile:   Holly Harrington is a 78 y.o. female with a hx of recently diagnosed A-fib, PSVT, mitral and aortic regurgitation, HTN, asthma, osteoporosis, and OSA on CPAP who is being seen today for the evaluation of new onset Afib and elevated troponin at the request of Dr. Lawence.  History of Present Illness:   Holly Harrington underwent ETT in 11/2019 that demonstrated decreased exercise capacity with hypertensive blood pressure response and was overall intermediate risk due to limited functional capacity.  There were no ischemic EKG changes noted at workload achieved.  Outpatient cardiac monitoring in 11/2019 demonstrated a predominant rhythm of sinus with an average rate of 79 bpm, 21 atrial runs lasting up to 14.1 seconds with a maximum rate of 160 bpm, no sustained arrhythmias or prolonged pauses were noted, and patient triggered events corresponded to sinus rhythm with PACs.  Echo in 01/2020 showed an EF of 55 to 60%, no regional wall motion abnormalities, mild LVH, grade 2 diastolic dysfunction, normal RV systolic function and ventricular cavity size, normal PASP, moderately dilated left atrium, mild to moderate mitral regurgitation, mild to moderate aortic insufficiency, and an estimated right atrial pressure of 3 mmHg.  Echo in 01/2021, demonstrated an EF of 60 to 65%, no regional wall motion abnormalities, grade 2 diastolic dysfunction, normal RV systolic function and ventricular cavity size, mildly elevated PASP estimated at 38.2 mmHg, moderate to severe mitral regurgitation (possibly severe), possible mild mitral valve prolapse of the posterior leaflet, mild to moderate aortic insufficiency (possibly moderate), mild to moderate tricuspid regurgitation,  and an estimated right atrial pressure of 3 mmHg.  In follow-up she had declined a TEE for further evaluation of her valvular heart disease.  Lexiscan  MPI in 04/2021 showed no significant ischemia or scar with an EF greater than 65% and was overall low risk.  No significant coronary artery calcification noted.  Echo in 01/2022 showed an EF of 60 to 65%, no regional wall motion abnormalities, grade 2 diastolic dysfunction, moderately dilated left atrium, moderate mitral regurgitation and mild aortic insufficiency.  Most recent echo from 01/2023 showed an EF of 60 to 65%, no regional wall motion abnormalities, mild LVH, normal RV systolic function and ventricular cavity size, normal RVSP, moderately dilated left atrium, moderate mitral regurgitation with moderate mitral annular calcification, mild to moderate aortic insufficiency, aortic valve sclerosis without evidence of stenosis, and borderline dilatation of the ascending aorta measuring 38 mm.  She was seen in the office on 12/27/2023 for urgent evaluation of elevated blood pressure, heart rate, and generalized malaise/fatigue.  She reported earlier in September for a few days she was so weak that it was hard to get out of bed.  She did not seek medical care at that time.  After several days he tested herself for COVID-19 and found it was positive.  She did not seek treatment as she was starting to feel better.  Upon evaluation in our office on 9/17, she reported going outside to work in her garden and suddenly began to feel more tired and short of breath with blood pressure in the 160s to 180s over 90s to low 100s and heart rates in the 80s to 1 teens bpm.  She was found to be in new onset A-fib with controlled ventricular response.  Upon exam there was concern for possible pneumonia and in the context of newly diagnosed A-fib she was referred to the ER.  Upon arrival to the ER she remained in rate controlled A-fib and was hemodynamically stable.  Afebrile.   Chest x-ray showed ill-defined right lower lobe airspace opacity concerning for pneumonia.  CTA chest was negative for PE with right middle lobe pneumonia.  High-sensitivity troponin 689, not cycled.  BNP 168.  Potassium 4.7.  She has received aspirin  324 mg x 1, IV azithromycin  and ceftriaxone , DuoNebs, metoprolol , and been placed on a heparin  drip.  At time of cardiology consult she remains in A-fib with controlled ventricular response.  She denies symptoms of chest pain or palpitations.  Dyspnea is improving.  No significant lower extremity swelling or progressive orthopnea.  No falls or symptoms concerning for bleeding.    Past Medical History:  Diagnosis Date   Asthma    Breast lump    Corneal ulcer    Fibroid    Hypertension    Obstructive sleep apnea    Osteopenia    Osteoporosis    PSVT (paroxysmal supraventricular tachycardia) (HCC)    Valvular heart disease 01/2020   Moderate-severe mitral regurgitation; mild aortic regurgitation    Past Surgical History:  Procedure Laterality Date   ANTERIOR AND POSTERIOR VAGINAL REPAIR     VAGINAL HYSTERECTOMY       Home Meds: Prior to Admission medications   Medication Sig Start Date End Date Taking? Authorizing Provider  albuterol  (VENTOLIN  HFA) 108 (90 Base) MCG/ACT inhaler Inhale into the lungs every 6 (six) hours as needed for wheezing or shortness of breath.   Yes [provider]  ARNUITY ELLIPTA 100 MCG/ACT AEPB Inhale 1 puff into the lungs daily.   Yes [provider]  azelastine (ASTELIN) 0.1 % nasal spray Place 1 spray into both nostrils 2 (two) times daily. Patient taking differently: Place 1 spray into both nostrils as needed. 03/21/22  Yes [provider]  Cholecalciferol  (VITAMIN D -3 PO) Take 1 capsule by mouth every evening.   Yes [provider]  clobetasol ointment (TEMOVATE) 0.05 % Apply 1 Application topically as needed.   Yes [provider]  diltiazem  (CARDIZEM ) 30 MG  tablet Take 1 tablet (30 mg total) by mouth 4 (four) times daily as needed (racing heart rate or palpitations). 05/10/23 12/27/23 Yes End, Lonni, MD  losartan  (COZAAR ) 100 MG tablet Take 1 tablet (100 mg total) by mouth daily. 02/02/23  Yes End, Lonni, MD  naproxen  sodium (ALEVE ) 220 MG tablet Take 220 mg by mouth as needed.   Yes [provider]  PROLIA 60 MG/ML SOSY injection Inject 60 mg into the skin every 6 (six) months.   Yes [provider]  Vibegron (GEMTESA) 75 MG TABS Take 75 mg by mouth daily.   Yes [provider]    Inpatient Medications: Scheduled Meds:  atorvastatin   80 mg Oral QHS   cholecalciferol   1,000 Units Oral QPM   guaiFENesin   600 mg Oral BID   ipratropium-albuterol   3 mL Nebulization QID   losartan   100 mg Oral QHS   metoprolol  tartrate  25 mg Oral BID   Continuous Infusions:  azithromycin      cefTRIAXone  (ROCEPHIN )  IV     heparin  800 Units/hr (12/28/23 0717)   lactated ringers  150 mL/hr (12/28/23 0118)   PRN Meds: acetaminophen  **OR** acetaminophen , diltiazem , ipratropium-albuterol , magnesium  hydroxide, morphine  injection, nitroGLYCERIN , ondansetron  **OR** ondansetron  (ZOFRAN ) IV, traZODone   Allergies:  Allergies  Allergen Reactions   Codeine Nausea And Vomiting, Dermatitis and Other (See Comments)    Other Reaction(s): Other (See Comments)   Hydrocodone    Oxycodone     Social History:   Social History   Socioeconomic History   Marital status: Unknown    Spouse name: Not on file   Number of children: Not on file   Years of education: Not on file   Highest education level: Not on file  Occupational History   Not on file  Tobacco Use   Smoking status: Never   Smokeless tobacco: Never  Vaping Use   Vaping status: Never Used  Substance and Sexual Activity   Alcohol use: Not Currently    Comment: O'Doul's occasionally   Drug use: No   Sexual activity: Not Currently    Birth control/protection: Surgical     Comment: hyst  Other Topics Concern   Not on file  Social History Narrative   Not on file   Social Drivers of Health   Financial Resource Strain: Low Risk  (08/21/2023)   Received from [facility]   Overall Financial Resource Strain (CARDIA)    Difficulty of Paying Living Expenses: Not hard at all  Food Insecurity: No Food Insecurity (12/28/2023)   Hunger Vital Sign    Worried About Running Out of Food in the Last Year: Never true    Ran Out of Food in the Last Year: Never true  Transportation Needs: No Transportation Needs (12/28/2023)   PRAPARE - Administrator, Civil Service (Medical): No    Lack of Transportation (Non-Medical): No  Physical Activity: Insufficiently Active (02/07/2023)   Received from [facility]   Exercise Vital Sign    On average, how many days per week do you engage in moderate to strenuous exercise (like a brisk walk)?: 3 days    On average, how many minutes do you engage in exercise at this level?: 20 min  Stress: No Stress Concern Present (02/07/2023)   Received from [facility] - Occupational Stress Questionnaire    Feeling of Stress : Not at all  Social Connections: Moderately Integrated (12/28/2023)   Social Connection and Isolation Panel    Frequency of Communication with Friends and Family: More than three times a week    Frequency of Social Gatherings with Friends and Family: More than three times a week    Attends Religious Services: More than 4 times per year    Active Member of [employer] or Organizations: Yes    Attends Engineer, structural: More than 4 times per year    Marital Status: Divorced  Intimate Partner Violence: Not At Risk (12/28/2023)   Humiliation, Afraid, Rape, and Kick questionnaire    Fear of Current or Ex-Partner: No    Emotionally Abused: No    Physically Abused: No    Sexually Abused: No     Family History:   Family History  Problem Relation Age  of Onset   Breast cancer Mother 105   Hypertension Brother    Stroke Brother    Hypertension Brother    Breast cancer Maternal Aunt 36    ROS:  Review of Systems  Constitutional:  Positive for malaise/fatigue. Negative for chills, diaphoresis, fever and weight loss.  HENT:  Negative for congestion.   Eyes:  Negative for discharge and redness.  Respiratory:  Positive for cough and shortness of breath. Negative for sputum production  and wheezing.   Cardiovascular:  Negative for chest pain, palpitations, orthopnea, claudication, leg swelling and PND.  Gastrointestinal:  Negative for abdominal pain, heartburn, nausea and vomiting.  Musculoskeletal:  Negative for falls and myalgias.  Skin:  Negative for rash.  Neurological:  Positive for weakness. Negative for dizziness, tingling, tremors, sensory change, speech change, focal weakness and loss of consciousness.  Endo/Heme/Allergies:  Does not bruise/bleed easily.  Psychiatric/Behavioral:  Negative for substance abuse. The patient is not nervous/anxious.   All other systems reviewed and are negative.     Physical Exam/Data:   Vitals:   12/28/23 0628 12/28/23 0630 12/28/23 0700 12/28/23 0730  BP:  (!) 155/94 (!) 177/89 136/79  Pulse:  80 (!) 39 80  Resp:  17 (!) 24 19  Temp: 97.9 F (36.6 C)     TempSrc: Oral     SpO2:  94% 99% 95%  Weight:   77.6 kg   Height:   5' 2.5 (1.588 m)     Intake/Output Summary (Last 24 hours) at 12/28/2023 0752 Last data filed at 12/28/2023 0717 Gross per 24 hour  Intake 556.86 ml  Output --  Net 556.86 ml   Filed Weights   12/28/23 0700  Weight: 77.6 kg   Body mass index is 30.78 kg/m.   Physical Exam: General: Well developed, well nourished, in no acute distress. Head: Normocephalic, atraumatic, sclera non-icteric, no xanthomas, nares without discharge.  Neck: Negative for carotid bruits. JVD not elevated. Lungs: Coarse and diminished breath sounds along the right midlung. Breathing is  unlabored. Heart: IRIR with S1 S2. II/VI systolic murmur, no rubs, or gallops appreciated. Abdomen: Soft, non-tender, non-distended with normoactive bowel sounds. No hepatomegaly. No rebound/guarding. No obvious abdominal masses. Msk:  Strength and tone appear normal for age. Extremities: No clubbing or cyanosis. No edema. Distal pedal pulses are 2+ and equal bilaterally. Neuro: Alert and oriented X 3. No facial asymmetry. No focal deficit. Moves all extremities spontaneously. Psych:  Responds to questions appropriately with a normal affect.   EKG:  The EKG was personally reviewed and demonstrates: A-fib, 88 bpm, possible prior septal infarct, poor R wave progression along the precordial leads Telemetry:  Telemetry was personally reviewed and demonstrates: Afib with ventricular rates in the 80s to low 100s bpm  Weights: Filed Weights   12/28/23 0700  Weight: 77.6 kg    Relevant CV Studies:  2D echo 01/19/2023: 1. Left ventricular ejection fraction, by estimation, is 60 to 65%. The  left ventricle has normal function. The left ventricle has no regional  wall motion abnormalities. There is mild left ventricular hypertrophy.  Left ventricular diastolic parameters  are indeterminate.   2. Right ventricular systolic function is normal. The right ventricular  size is normal. There is normal pulmonary artery systolic pressure.   3. Left atrial size was moderately dilated.   4. The mitral valve is abnormal. Moderate mitral valve regurgitation. No  evidence of mitral stenosis. Moderate mitral annular calcification.   5. The aortic valve is normal in structure. Aortic valve regurgitation is  mild to moderate. Aortic valve sclerosis/calcification is present, without  any evidence of aortic stenosis.   6. Aortic dilatation noted. There is borderline dilatation of the  ascending aorta, measuring 38 mm.   7. The inferior vena cava is normal in size with greater than 50%  respiratory  variability, suggesting right atrial pressure of 3 mmHg.   Laboratory Data:  Chemistry Recent Labs  Lab 12/27/23 1729 12/28/23 0614  NA 137  138  K 4.7 3.9  CL 104 104  CO2 19* 24  GLUCOSE 93 106*  BUN 17 12  CREATININE 0.71 0.61  CALCIUM  9.6 8.9  GFRNONAA >60 >60  ANIONGAP 14 10    No results for input(s): PROT, ALBUMIN, AST, ALT, ALKPHOS, BILITOT in the last 168 hours. Hematology Recent Labs  Lab 12/27/23 1729 12/28/23 0614  WBC 10.2 8.9  RBC 5.06 4.33  HGB 14.1 12.1  HCT 49.3* 38.5  MCV 97.4 88.9  MCH 27.9 27.9  MCHC 28.6* 31.4  RDW 13.9 14.0  PLT 310 278   Cardiac EnzymesNo results for input(s): TROPONINI in the last 168 hours. No results for input(s): TROPIPOC in the last 168 hours.  BNP Recent Labs  Lab 12/27/23 2052  BNP 168.0*    DDimer No results for input(s): DDIMER in the last 168 hours.  Radiology/Studies:  CT Angio Chest PE W/Cm &/Or Wo Cm Result Date: 12/27/2023 IMPRESSION: No evidence of pulmonary emboli. Right middle lobe pneumonia. No other focal abnormality is seen. Electronically Signed   By: Oneil Devonshire M.D.   On: 12/27/2023 22:20   DG Chest 2 View Result Date: 12/27/2023 IMPRESSION: Ill-defined right lower lobe airspace opacity is noted concerning for pneumonia. Followup PA and lateral chest X-ray is recommended in 3-4 weeks following trial of antibiotic therapy to ensure resolution and exclude underlying malignancy. Electronically Signed   By: Lynwood Landy Raddle M.D.   On: 12/27/2023 18:19    Assessment and Plan:   1. New onset Afib: -Likely in the setting of PNA and recent COVID illness, though chronicity is uncertain -Rates reasonably controlled in the 80s to low 100s bpm -Escalate Lopressor  to 25 mg bid for added rate control  -Asymptomatic, plan for rate control currently with acute pulmonary illness unless rates become difficult to control or is asymptomatic with ambulation as acute illness improves -Obtain  echo -Plan for outpatient DCCV if she remains in Afib following acute illness and after she has been adequately anticoagulated for a minimum of 3-4 weeks without interruption  -CHADS2VASc at least 5 (HTN, age x 2, vascular disease, sex category) -Heparin  gtt for now until it is clear she will not require invasive inpatient procedures -Transition from heparin  gtt to DOAC prior to discharge  -Check TSH and magnesium  -Potassium at goal/normal  2. Elevated troponin: -Never with chest pain -Possibly supply/demand ischemia in the setting of new onset A-fib with right midlung pneumonia versus NSTEMI -Initial troponin 689 in the ED, not cycled  -Trend troponin to peak -Heparin  gtt as above for now -Nuclear stress test in 04/2021 showed no evidence of ischemia or scar with no significant coronary artery calcification noted  3.  Mitral and aortic regurgitation: - Echo pending  4.  HTN: - Blood pressure reasonably controlled in the ED - Remains on losartan  for now, if needed for added ventricular rate control, could discontinue to allow for escalation of AV nodal medications - Titrate Lopressor  to 25 mg twice daily as above  5.  Sepsis secondary to CAP with recent COVID illness: - Management per internal medicine  6.  OSA: - CPAP recommended while admitted  7.  Aortic atherosclerosis: - LDL 98 in 09/2022 with target LDL less than 70 - Update lipid panel      For questions or updates, please contact [facility] Please consult [email or web address] for contact info under Cardiology/STEMI.   Signed, Bernardino Bring, PA-C [facility] Pager: [phone] 12/28/2023, 7:52 AM

## 2023-12-28 NOTE — Consult Note (Signed)
 Pharmacy Consult Note - Anticoagulation  Pharmacy Consult for Heparin  Indication: chest pain/ACS Allergies  Allergen Reactions   Codeine Nausea And Vomiting, Dermatitis and Other (See Comments)    Other Reaction(s): Other (See Comments)   Hydrocodone    Oxycodone     PATIENT MEASUREMENTS: Height: 5' 2.5 (158.8 cm) Weight: 77.6 kg (171 lb) IBW/kg (Calculated) : 51.25 HEPARIN  DW (KG): 68.1  VITAL SIGNS: Temp: 97.9 F (36.6 C) (09/18 0628) Temp Source: Oral (09/18 0628) BP: 136/79 (09/18 0730) Pulse Rate: 80 (09/18 0730)  Recent Labs    12/27/23 2221 12/28/23 0614 12/28/23 0759  HGB  --  12.1  --   HCT  --  38.5  --   PLT  --  278  --   APTT 31  --   --   LABPROT 14.6 14.3  --   INR 1.1 1.1  --   HEPARINUNFRC <0.10* <0.10*  --   CREATININE  --  0.61  --   TROPONINIHS  --   --  813*    Estimated Creatinine Clearance: 54.7 mL/min (by C-G formula based on SCr of 0.61 mg/dL).  PAST MEDICAL HISTORY: Past Medical History:  Diagnosis Date   Asthma    Breast lump    Corneal ulcer    Fibroid    Hypertension    Obstructive sleep apnea    Osteopenia    Osteoporosis    PSVT (paroxysmal supraventricular tachycardia) (HCC)    Valvular heart disease 01/2020   Moderate-severe mitral regurgitation; mild aortic regurgitation    Medications:  (Not in a hospital admission)  Scheduled:   atorvastatin   80 mg Oral QHS   cholecalciferol   1,000 Units Oral QPM   diltiazem   120 mg Oral Daily   guaiFENesin   600 mg Oral BID   ipratropium-albuterol   3 mL Nebulization QID   losartan   100 mg Oral QHS   metoprolol  tartrate  25 mg Oral BID   Infusions:   azithromycin      cefTRIAXone  (ROCEPHIN )  IV     heparin  800 Units/hr (12/28/23 0717)   lactated ringers  150 mL/hr (12/28/23 0118)   PRN:   ASSESSMENT: 78 y.o. female with PMH SVT is presenting with SOB. Patient is not on chronic anticoagulation per chart review. Pharmacy has been consulted to initiate and manage heparin   intravenous infusion.   Goal(s) of therapy: Heparin  level 0.3 - 0.7 units/mL aPTT 66 - 102 seconds Monitor platelets by anticoagulation protocol: Yes   Baseline anticoagulation labs: Recent Labs    12/27/23 1729 12/27/23 2221 12/28/23 0614  APTT  --  31  --   INR  --  1.1 1.1  HGB 14.1  --  12.1  PLT 310  --  278  Baseline labs pending: HL, INR, aPTT    09/18 0614 HL <0.10, subtherapeutic at 800 u/hr  PLAN: HL subtherapeutic Give heparin  2000 units IV x 1 Increase heparin  infusion to 1000 units/hr Recheck HL 8 hours after rate change Daily CBC while on heparin   Kayla JULIANNA Blew, PharmD Clinical Pharmacist 12/28/2023 9:21 AM

## 2023-12-28 NOTE — Progress Notes (Signed)
*  PRELIMINARY RESULTS* Echocardiogram 2D Echocardiogram has been performed.  Holly Harrington 12/28/2023, 2:49 PM

## 2023-12-28 NOTE — Progress Notes (Signed)
 Patient self-administered home medication Gemtesa unwitnessed . Nurse educated patient on needing to check in all home meds prior to administration so that they can be accounted for.

## 2023-12-29 ENCOUNTER — Other Ambulatory Visit (HOSPITAL_COMMUNITY): Payer: Self-pay

## 2023-12-29 DIAGNOSIS — R7989 Other specified abnormal findings of blood chemistry: Secondary | ICD-10-CM | POA: Diagnosis not present

## 2023-12-29 DIAGNOSIS — E782 Mixed hyperlipidemia: Secondary | ICD-10-CM

## 2023-12-29 DIAGNOSIS — I4891 Unspecified atrial fibrillation: Secondary | ICD-10-CM | POA: Diagnosis not present

## 2023-12-29 DIAGNOSIS — J189 Pneumonia, unspecified organism: Secondary | ICD-10-CM | POA: Diagnosis not present

## 2023-12-29 DIAGNOSIS — I214 Non-ST elevation (NSTEMI) myocardial infarction: Secondary | ICD-10-CM | POA: Diagnosis not present

## 2023-12-29 DIAGNOSIS — R531 Weakness: Secondary | ICD-10-CM

## 2023-12-29 LAB — CBC
HCT: 38.8 % (ref 36.0–46.0)
Hemoglobin: 12 g/dL (ref 12.0–15.0)
MCH: 28 pg (ref 26.0–34.0)
MCHC: 30.9 g/dL (ref 30.0–36.0)
MCV: 90.4 fL (ref 80.0–100.0)
Platelets: 274 K/uL (ref 150–400)
RBC: 4.29 MIL/uL (ref 3.87–5.11)
RDW: 14.2 % (ref 11.5–15.5)
WBC: 9.3 K/uL (ref 4.0–10.5)
nRBC: 0 % (ref 0.0–0.2)

## 2023-12-29 LAB — HEPARIN LEVEL (UNFRACTIONATED): Heparin Unfractionated: 0.41 [IU]/mL (ref 0.30–0.70)

## 2023-12-29 MED ORDER — AMOXICILLIN-POT CLAVULANATE 875-125 MG PO TABS
1.0000 | ORAL_TABLET | Freq: Two times a day (BID) | ORAL | Status: DC
  Administered 2023-12-30: 1 via ORAL
  Filled 2023-12-29: qty 1

## 2023-12-29 MED ORDER — MIRABEGRON ER 25 MG PO TB24
25.0000 mg | ORAL_TABLET | Freq: Every day | ORAL | Status: DC
  Administered 2023-12-29: 25 mg via ORAL
  Filled 2023-12-29: qty 1

## 2023-12-29 MED ORDER — GUAIFENESIN-DM 100-10 MG/5ML PO SYRP
15.0000 mL | ORAL_SOLUTION | Freq: Four times a day (QID) | ORAL | Status: DC
  Administered 2023-12-29 – 2023-12-30 (×3): 15 mL via ORAL
  Filled 2023-12-29 (×3): qty 20

## 2023-12-29 MED ORDER — IPRATROPIUM-ALBUTEROL 0.5-2.5 (3) MG/3ML IN SOLN
3.0000 mL | Freq: Two times a day (BID) | RESPIRATORY_TRACT | Status: DC
  Administered 2023-12-29 – 2023-12-30 (×2): 3 mL via RESPIRATORY_TRACT
  Filled 2023-12-29 (×2): qty 3

## 2023-12-29 MED ORDER — METOPROLOL TARTRATE 50 MG PO TABS
50.0000 mg | ORAL_TABLET | Freq: Two times a day (BID) | ORAL | Status: DC
  Administered 2023-12-29 – 2023-12-30 (×3): 50 mg via ORAL
  Filled 2023-12-29 (×3): qty 1

## 2023-12-29 MED ORDER — APIXABAN 5 MG PO TABS
5.0000 mg | ORAL_TABLET | Freq: Two times a day (BID) | ORAL | Status: DC
Start: 2023-12-29 — End: 2023-12-30
  Administered 2023-12-29 – 2023-12-30 (×3): 5 mg via ORAL
  Filled 2023-12-29 (×3): qty 1

## 2023-12-29 NOTE — Care Management Important Message (Signed)
 Important Message  Patient Details  Name: Holly Harrington MRN: [id number] Date of Birth: [birth year]   Important Message Given:  Yes - Medicare IM     Holly Harrington 12/29/2023, 4:49 PM

## 2023-12-29 NOTE — Progress Notes (Signed)
 Progress Note  Patient Name: Holly Harrington Date of Encounter: 12/29/2023  Primary Cardiologist: End  Subjective   Echo yesterday showed preserved LV systolic function with normal wall motion, mild LVH, normal RV systolic function, moderate mitral annular calcification with mild mitral regurgitation, aortic valve sclerosis with mild aortic insufficiency, and normal right atrial pressure.  Remains in Afib with ventricular rates in the 70s to low 100s bpm largely with brief paroxysms into the 120s bpm. No chest pain or palpitations. Has a cough. Dyspnea improving.   Inpatient Medications    Scheduled Meds:  atorvastatin   80 mg Oral QHS   cholecalciferol   1,000 Units Oral QPM   diltiazem   120 mg Oral Daily   guaiFENesin   600 mg Oral BID   ipratropium-albuterol   3 mL Nebulization QID   losartan   100 mg Oral QHS   metoprolol  tartrate  50 mg Oral BID   Continuous Infusions:  azithromycin  500 mg (12/28/23 2223)   cefTRIAXone  (ROCEPHIN )  IV 2 g (12/28/23 2134)   heparin  1,200 Units/hr (12/28/23 2025)   PRN Meds: acetaminophen  **OR** acetaminophen , ipratropium-albuterol , magnesium  hydroxide, morphine  injection, nitroGLYCERIN , ondansetron  **OR** ondansetron  (ZOFRAN ) IV, traZODone    Vital Signs    Vitals:   12/28/23 2018 12/28/23 2309 12/29/23 0327 12/29/23 0733  BP: 135/82 (!) 140/72 (!) 144/67 (!) 148/71  Pulse: (!) 101 94 81 76  Resp: 18 19 16 18   Temp: 98 F (36.7 C) 98 F (36.7 C) 97.9 F (36.6 C) 97.9 F (36.6 C)  TempSrc:      SpO2: 95% 94% 93% 94%  Weight:      Height:        Intake/Output Summary (Last 24 hours) at 12/29/2023 0838 Last data filed at 12/28/2023 1927 Gross per 24 hour  Intake 1179 ml  Output --  Net 1179 ml   Filed Weights   12/28/23 0700  Weight: 77.6 kg    Telemetry    Afib with ventricular rates in the 70s to low 100s bpm largely with brief paroxysms into the 120s bpm - Personally Reviewed  ECG    No new tracings - Personally  Reviewed  Physical Exam   GEN: No acute distress.   Neck: No JVD. Cardiac: IRIR, II/VI systolic murmur, no rubs, or gallops.  Respiratory: Coarse breath sounds R>L.  GI: Soft, nontender, non-distended.   MS: No edema; No deformity. Neuro:  Alert and oriented x 3; Nonfocal.  Psych: Normal affect.  Labs    Chemistry Recent Labs  Lab 12/27/23 1729 12/28/23 0614  NA 137 138  K 4.7 3.9  CL 104 104  CO2 19* 24  GLUCOSE 93 106*  BUN 17 12  CREATININE 0.71 0.61  CALCIUM  9.6 8.9  GFRNONAA >60 >60  ANIONGAP 14 10     Hematology Recent Labs  Lab 12/27/23 1729 12/28/23 0614 12/29/23 0349  WBC 10.2 8.9 9.3  RBC 5.06 4.33 4.29  HGB 14.1 12.1 12.0  HCT 49.3* 38.5 38.8  MCV 97.4 88.9 90.4  MCH 27.9 27.9 28.0  MCHC 28.6* 31.4 30.9  RDW 13.9 14.0 14.2  PLT 310 278 274    Cardiac EnzymesNo results for input(s): TROPONINI in the last 168 hours. No results for input(s): TROPIPOC in the last 168 hours.   BNP Recent Labs  Lab 12/27/23 2052  BNP 168.0*     DDimer No results for input(s): DDIMER in the last 168 hours.   Radiology    CT Angio Chest PE W/Cm &/Or Wo Cm  Result Date: 12/27/2023 IMPRESSION: No evidence of pulmonary emboli. Right middle lobe pneumonia. No other focal abnormality is seen. Electronically Signed   By: Oneil Devonshire M.D.   On: 12/27/2023 22:20   DG Chest 2 View Result Date: 12/27/2023 IMPRESSION: Ill-defined right lower lobe airspace opacity is noted concerning for pneumonia. Followup PA and lateral chest X-ray is recommended in 3-4 weeks following trial of antibiotic therapy to ensure resolution and exclude underlying malignancy. Electronically Signed   By: Lynwood Landy Raddle M.D.   On: 12/27/2023 18:19    Cardiac Studies   2D echo 12/28/2023: 1. Left ventricular ejection fraction, by estimation, is 60 to 65%. The  left ventricle has normal function. The left ventricle has no regional  wall motion abnormalities. There is mild left ventricular  hypertrophy.  Left ventricular diastolic parameters  are indeterminate.   2. Right ventricular systolic function is normal. The right ventricular  size is normal.   3. Left atrial size was mild to moderately dilated.   4. The mitral valve is normal in structure. Mild mitral valve  regurgitation. No evidence of mitral stenosis. Moderate mitral annular  calcification.   5. The aortic valve is normal in structure. Aortic valve regurgitation is  mild. Aortic valve sclerosis is present, with no evidence of aortic valve  stenosis.   6. The inferior vena cava is normal in size with greater than 50%  respiratory variability, suggesting right atrial pressure of 3 mmHg.   Patient Profile     78 y.o. female with history of recently diagnosed A-fib, PSVT, mitral and aortic regurgitation, HTN, asthma, osteoporosis, and OSA on CPAP who is being seen today for the evaluation of new onset Afib and elevated troponin at the request of Dr. Lawence.   Assessment & Plan    1.  New onset A-fib: - Likely in the setting of PNA and recent COVID illness, though chronicity is uncertain  - Rates largely in the 70s to low 100s bpm with brief paroxysms into the 120s bpm - Continue newly added Cardizem  CD 120 mg daily - Titrate Lopressor  to 50 mg bid - Asymptomatic, plan for rate control currently with acute pulmonary illness unless rates become difficult to control or is asymptomatic with ambulation as acute illness improves  - Plan for outpatient DCCV if she remains in Afib following acute illness and after she has been adequately anticoagulated for a minimum of 3-4 weeks without interruption  - CHADS2VASc at least 5 (HTN, age x 2, vascular disease, sex category)  - TSH, magnesium , and potassium normal - Heparin  gtt for now until it is clear she will not require invasive inpatient procedures - Transition from heparin  gtt to DOAC prior to discharge   2.  Elevated troponin: - Never with chest pain - Possibly  supply/demand ischemia in the setting of new onset A-fib with right midlung pneumonia versus NSTEMI - Initial troponin 689 in the ED with repeat troponin 813 - Heparin  gtt for 48 hours  - Echo with preserved LV systolic function and normal wall motion - Nuclear stress test in 04/2021 showed no evidence of ischemia or scar with no significant coronary artery calcification noted - In the outpatient setting she has improved from acute illness, pursue ischemic testing, this is deferred at this time given lack of anginal symptoms, reassuring echo, and in the setting of acute illness with ongoing bronchospastic cough  3.  Mitral and aortic regurgitation: - Stable on echo this admission  4.  HTN: - Blood  pressure largely in the 130s to 140s mmHg systolic - Increase Lopressor  to 50 mg bid - Continue recently started Cardizem  CD 120 mg - Titrate as needed  5. Sepsis secondary to CAP with recent COVID illness:  - Management per IM  6. OSA: - CPAP recommended - Unable to wear overnight secondary to cough  7. Aortic atherosclerosis:  - Mild, noted on CT imaging - Target LDL less than 70      For questions or updates, please contact [facility] Please consult [email or web address] for contact info under Cardiology/STEMI.    Signed, Bernardino Bring, PA-C [facility] Pager: [phone] 12/29/2023, 8:38 AM

## 2023-12-29 NOTE — Progress Notes (Signed)
 Patients home medication gemtesa taken to pharmacy for storage.

## 2023-12-29 NOTE — TOC Benefit Eligibility Note (Signed)
 Pharmacy Patient Advocate Encounter  Insurance verification completed.    The patient is insured through [employer]. Patient has Medicare and is not eligible for a copay card, but may be able to apply for patient assistance or Medicare RX Payment Plan (Patient Must reach out to their plan, if eligible for payment plan), if available.    Ran test claim for Eliquis 5mg  and the current 30 day co-pay is $0.   This test claim was processed through [employer]- copay amounts may vary at other pharmacies due to [employer], or as the patient moves through the different stages of their insurance plan.

## 2023-12-29 NOTE — Progress Notes (Signed)
 Mobility Specialist - Progress Note  Post-mobility: HR 95, SPO2 96%   12/29/23 1246  Mobility  Activity Ambulated with assistance  Level of Assistance Standby assist, set-up cues, supervision of patient - no hands on  Assistive Device None  Distance Ambulated (ft) 160 ft  Activity Response Tolerated well  Mobility visit 1 Mobility  Mobility Specialist Start Time (ACUTE ONLY) 1122  Mobility Specialist Stop Time (ACUTE ONLY) 1135  Mobility Specialist Time Calculation (min) (ACUTE ONLY) 13 min   Pt sitting in the recliner upon entry, utilizing RA. Pt amb to the bathroom w/ supervision, dons pants and completes peri care indep. Pt amb one lap around the NS with sup, tolerated well. Pt expressed min SOB upon return to the room, O2 >90%. Pt left seated in the recliner with needs within reach.  America Silvan Mobility Specialist 12/29/23 12:50 PM

## 2023-12-29 NOTE — Progress Notes (Signed)
 [facility]  - [place] at [facility]   PATIENT NAME: Holly Harrington    MR#:  [id number]  DATE OF BIRTH:  October 25, 1945  SUBJECTIVE:  Pt's son at bedside.  Sitting out with the recliner chair. Has dry cough. Feels better than yesterday. Tolerating PO diet. Walked around the nurses station with mobility therapist. Got short winded later part of the walk. No hypoxia noted denies chest pain. Heartrate stable.   VITALS:  Blood pressure (!) 150/66, pulse 76, temperature 97.7 F (36.5 C), resp. rate 18, height 5' 2.5 (1.588 m), weight 77.6 kg, SpO2 96%.  PHYSICAL EXAMINATION:   GENERAL:  78 y.o.-year-old patient with no acute distress.  LUNGS: Normal breath sounds bilaterally, no wheezing CARDIOVASCULAR: S1, S2 normal. No murmur irregular rhythm ABDOMEN: Soft, nontender, nondistended. Bowel sounds present.  EXTREMITIES: No  edema b/l.    NEUROLOGIC: nonfocal  patient is alert and awake, weak SKIN: No obvious rash, lesion, or ulcer.   LABORATORY PANEL:  CBC Recent Labs  Lab 12/29/23 0349  WBC 9.3  HGB 12.0  HCT 38.8  PLT 274    Chemistries  Recent Labs  Lab 12/28/23 0614  NA 138  K 3.9  CL 104  CO2 24  GLUCOSE 106*  BUN 12  CREATININE 0.61  CALCIUM  8.9  MG 2.0   Cardiac Enzymes No results for input(s): TROPONINI in the last 168 hours. RADIOLOGY:  ECHOCARDIOGRAM COMPLETE Result Date: 12/28/2023    ECHOCARDIOGRAM REPORT   Patient Name:   Holly Harrington Date of Exam: 12/28/2023 Medical Rec #:  [id number]        Height:       62.5 in Accession #:    [id number]       Weight:       171.0 lb Date of Birth:  July 12, 1945         BSA:          1.799 m Patient Age:    78 years         BP:           170/82 mmHg Patient Gender: F                HR:           89 bpm. Exam Location:  [facility] Procedure: 2D Echo, Cardiac Doppler and Color Doppler (Both Spectral and Color            Flow Doppler were utilized during procedure). Indications:     Elevated troponin  History:          Patient has prior history of Echocardiogram examinations, most                  recent 01/19/2023. Risk Factors:Hypertension. Valvular heart                  disease.  Sonographer:     Christopher Furnace Referring Phys:  [id number] EVALENE JINNY LUNGER Diagnosing Phys: EVALENE LUNGER MD IMPRESSIONS  1. Left ventricular ejection fraction, by estimation, is 60 to 65%. The left ventricle has normal function. The left ventricle has no regional wall motion abnormalities. There is mild left ventricular hypertrophy. Left ventricular diastolic parameters are indeterminate.  2. Right ventricular systolic function is normal. The right ventricular size is normal.  3. Left atrial size was mild to moderately dilated.  4. The mitral valve is normal in structure. Mild mitral valve regurgitation. No evidence of mitral stenosis. Moderate mitral annular calcification.  5.  The aortic valve is normal in structure. Aortic valve regurgitation is mild. Aortic valve sclerosis is present, with no evidence of aortic valve stenosis.  6. The inferior vena cava is normal in size with greater than 50% respiratory variability, suggesting right atrial pressure of 3 mmHg. FINDINGS  Left Ventricle: Left ventricular ejection fraction, by estimation, is 60 to 65%. The left ventricle has normal function. The left ventricle has no regional wall motion abnormalities. Strain was performed and the global longitudinal strain is indeterminate. The left ventricular internal cavity size was normal in size. There is mild left ventricular hypertrophy. Left ventricular diastolic parameters are indeterminate. Right Ventricle: The right ventricular size is normal. No increase in right ventricular wall thickness. Right ventricular systolic function is normal. Left Atrium: Left atrial size was mild to moderately dilated. Right Atrium: Right atrial size was normal in size. Pericardium: There is no evidence of pericardial effusion. Mitral Valve: The mitral valve is normal in  structure. Moderate mitral annular calcification. Mild mitral valve regurgitation. No evidence of mitral valve stenosis. Tricuspid Valve: The tricuspid valve is normal in structure. Tricuspid valve regurgitation is not demonstrated. No evidence of tricuspid stenosis. Aortic Valve: The aortic valve is normal in structure. Aortic valve regurgitation is mild. Aortic valve sclerosis is present, with no evidence of aortic valve stenosis. Aortic valve mean gradient measures 2.0 mmHg. Aortic valve peak gradient measures 3.3  mmHg. Aortic valve area, by VTI measures 3.65 cm. Pulmonic Valve: The pulmonic valve was normal in structure. Pulmonic valve regurgitation is not visualized. No evidence of pulmonic stenosis. Aorta: The aortic root is normal in size and structure. Venous: The inferior vena cava is normal in size with greater than 50% respiratory variability, suggesting right atrial pressure of 3 mmHg. IAS/Shunts: No atrial level shunt detected by color flow Doppler. Additional Comments: 3D was performed not requiring image post processing on an independent workstation and was indeterminate.  LEFT VENTRICLE PLAX 2D LVIDd:         4.80 cm LVIDs:         2.50 cm LV PW:         1.40 cm LV IVS:        1.00 cm LVOT diam:     2.00 cm LV SV:         61 LV SV Index:   34 LVOT Area:     3.14 cm  RIGHT VENTRICLE RV Basal diam:  3.50 cm RV Mid diam:    3.10 cm LEFT ATRIUM             Index        RIGHT ATRIUM           Index LA diam:        4.10 cm 2.28 cm/m   RA Area:     17.20 cm LA Vol (A2C):   95.2 ml 52.91 ml/m  RA Volume:   43.80 ml  24.34 ml/m LA Vol (A4C):   76.0 ml 42.24 ml/m LA Biplane Vol: 86.5 ml 48.08 ml/m  AORTIC VALVE AV Area (Vmax):    3.15 cm AV Area (Vmean):   2.96 cm AV Area (VTI):     3.65 cm AV Vmax:           91.50 cm/s AV Vmean:          67.150 cm/s AV VTI:            0.168 m AV Peak Grad:      3.3 mmHg  AV Mean Grad:      2.0 mmHg LVOT Vmax:         91.80 cm/s LVOT Vmean:        63.200 cm/s LVOT  VTI:          0.195 m LVOT/AV VTI ratio: 1.16  AORTA Ao Root diam: 2.70 cm MITRAL VALVE                TRICUSPID VALVE MV Area (PHT): 3.79 cm     TR Peak grad:   42.0 mmHg MV Decel Time: 200 msec     TR Vmax:        324.00 cm/s MV E velocity: 144.00 cm/s                             SHUNTS                             Systemic VTI:  0.20 m                             Systemic Diam: 2.00 cm Evalene Lunger MD Electronically signed by Evalene Lunger MD Signature Date/Time: 12/28/2023/2:57:17 PM    Final    CT Angio Chest PE W/Cm &/Or Wo Cm Result Date: 12/27/2023 CLINICAL DATA:  Increased breath sounds and difficulty breathing, initial encounter EXAM: CT ANGIOGRAPHY CHEST WITH CONTRAST TECHNIQUE: Multidetector CT imaging of the chest was performed using the standard protocol during bolus administration of intravenous contrast. Multiplanar CT image reconstructions and MIPs were obtained to evaluate the vascular anatomy. RADIATION DOSE REDUCTION: This exam was performed according to the departmental dose-optimization program which includes automated exposure control, adjustment of the mA and/or kV according to patient size and/or use of iterative reconstruction technique. CONTRAST:  75mL OMNIPAQUE  IOHEXOL  350 MG/ML SOLN COMPARISON:  Chest x-ray from earlier in the same day. FINDINGS: Cardiovascular: Thoracic aorta shows atherosclerotic calcifications without aneurysmal dilatation or dissection. No cardiac enlargement is noted. The pulmonary artery shows a normal branching pattern bilaterally. No intraluminal filling defect to suggest pulmonary embolism is noted. Mild coronary calcifications are seen. Mediastinum/Nodes: Thoracic inlet is within normal limits. No hilar or mediastinal adenopathy is noted. The esophagus as visualized is within normal limits. Lungs/Pleura: Lungs are well aerated bilaterally. Mosaic attenuation changes are noted throughout both lungs consistent with air trapping. Focal right middle lobe  infiltrate is noted which corresponds to that seen on the prior exam. Some inspissated material is noted within the lower lobe bronchial tree. No sizable effusion is noted. Upper Abdomen: Visualized upper abdomen is within normal limits. Musculoskeletal: Degenerative changes of the thoracic spine are noted. Review of the MIP images confirms the above findings. IMPRESSION: No evidence of pulmonary emboli. Right middle lobe pneumonia. No other focal abnormality is seen. Electronically Signed   By: Oneil Devonshire M.D.   On: 12/27/2023 22:20   DG Chest 2 View Result Date: 12/27/2023 CLINICAL DATA:  Shortness of breath. EXAM: CHEST - 2 VIEW COMPARISON:  September 26, 2019. FINDINGS: Stable cardiomediastinal silhouette. Ill-defined airspace opacity is noted in right lower lobe concerning for pneumonia. Left lung is clear. Bony thorax is unremarkable. IMPRESSION: Ill-defined right lower lobe airspace opacity is noted concerning for pneumonia. Followup PA and lateral chest X-ray is recommended in 3-4 weeks following trial of antibiotic therapy to ensure resolution and exclude underlying malignancy. Electronically  Signed   By: Lynwood Landy Raddle M.D.   On: 12/27/2023 18:19    Assessment and Plan  DARNETTE LAMPRON is a 78 y.o. female with a past medical history of hypertension, SVT, and asthma who presents to the ED complaining of shortness of breath.  Patient reports that she has been feeling generally fatigued with exertion and increasing difficulty breathing over the past month.  She tested positive for COVID-19 about 2 weeks ago, had a cough at that time, but this has since improved.  She has continued to feel short of breath and her difficulty breathing seemed acutely worse while she was out in the garden today.   She went to see her cardiologist, who found her to be in atrial fibrillation. Due to ongoing difficulty breathing, she was referred to the ED for further evaluation.   Chest CT revealed right middle lobe  pneumonia with no evidence for PE.   Sepsis due to right middle lobe pneumonia (HCC) history of COVID two weeks ago - continue  IV Rocephin  and Zithromax . - Mucolytic therapy be provided as well as duo nebs q.i.d. and q.4 hours p.r.n. - blood cultures so far negative -- received IV fluids with lactated ringer  -- change antibiotics to oral. Give cough suppressant -- blood cultures negative   new onset a fib with RVR -- likely in the setting of pneumonia in recent COVID and less -- currently on IV heparin  drip -- continue beta-blockers -- appreciate [facility] cardiology recommendation to switch to oral eliquis  an increased dose of beta blockers   elevated troponin appears demand ischemia in the setting of sepsis  - cardiology consultation with Dr. Gollan -- troponin 680--- 813 -- continue IV heparin  drip-- now discontinued -- patient denies chest pain  Asthma, chronic - prn nebs   Essential hypertension -  continue antihypertensive therapy.  Procedures: Family communication : son at bedside Consults : cardiology CODE STATUS: full DVT Prophylaxis : eliquis  Level of care: Telemetry Cardiac Status is: Inpatient Remains inpatient appropriate because: Sepsis due to pneumonia/news onset a fib  If remains stable patient can discharge either today or tomorrow    TOTAL TIME TAKING CARE OF THIS PATIENT: 40 minutes.  >50% time spent on counselling and coordination of care  Note: This dictation was prepared with Dragon dictation along with smaller phrase technology. Any transcriptional errors that result from this process are unintentional.  Leita Blanch M.D    [facility]   CC: Primary care physician; Care, [facility]

## 2023-12-29 NOTE — Consult Note (Signed)
 Pharmacy Consult Note - Anticoagulation  Pharmacy Consult for Heparin  Indication: chest pain/ACS Allergies  Allergen Reactions   Codeine Nausea And Vomiting, Dermatitis and Other (See Comments)    Other Reaction(s): Other (See Comments)   Hydrocodone    Oxycodone     PATIENT MEASUREMENTS: Height: 5' 2.5 (158.8 cm) Weight: 77.6 kg (171 lb) IBW/kg (Calculated) : 51.25 HEPARIN  DW (KG): 68.1  VITAL SIGNS: Temp: 97.9 F (36.6 C) (09/19 0327) BP: 144/67 (09/19 0327) Pulse Rate: 81 (09/19 0327)  Recent Labs    12/27/23 2221 12/28/23 0614 12/28/23 0759 12/28/23 1758 12/29/23 0349  HGB  --  12.1  --   --  12.0  HCT  --  38.5  --   --  38.8  PLT  --  278  --   --  274  APTT 31  --   --   --   --   LABPROT 14.6 14.3  --   --   --   INR 1.1 1.1  --   --   --   HEPARINUNFRC <0.10* <0.10*  --    < > 0.41  CREATININE  --  0.61  --   --   --   TROPONINIHS  --   --  813*  --   --    < > = values in this interval not displayed.    Estimated Creatinine Clearance: 54.7 mL/min (by C-G formula based on SCr of 0.61 mg/dL).  PAST MEDICAL HISTORY: Past Medical History:  Diagnosis Date   Asthma    Breast lump    Corneal ulcer    Fibroid    Hypertension    Obstructive sleep apnea    Osteopenia    Osteoporosis    PSVT (paroxysmal supraventricular tachycardia) (HCC)    Valvular heart disease 01/2020   Moderate-severe mitral regurgitation; mild aortic regurgitation    Medications:  Medications Prior to Admission  Medication Sig Dispense Refill Last Dose/Taking   albuterol  (VENTOLIN  HFA) 108 (90 Base) MCG/ACT inhaler Inhale into the lungs every 6 (six) hours as needed for wheezing or shortness of breath.   Unknown   ARNUITY ELLIPTA 100 MCG/ACT AEPB Inhale 1 puff into the lungs daily.   Unknown   azelastine (ASTELIN) 0.1 % nasal spray Place 1 spray into both nostrils 2 (two) times daily. (Patient taking differently: Place 1 spray into both nostrils as needed.)   Unknown    Cholecalciferol  (VITAMIN D -3 PO) Take 1 capsule by mouth every evening.   12/26/2023 Evening   clobetasol ointment (TEMOVATE) 0.05 % Apply 1 Application topically as needed.   Unknown   diltiazem  (CARDIZEM ) 30 MG tablet Take 1 tablet (30 mg total) by mouth 4 (four) times daily as needed (racing heart rate or palpitations). 120 tablet 5 12/27/2023 Morning   losartan  (COZAAR ) 100 MG tablet Take 1 tablet (100 mg total) by mouth daily. 90 tablet 3 12/26/2023 at  7:30 PM   naproxen  sodium (ALEVE ) 220 MG tablet Take 220 mg by mouth as needed.   Unknown   PROLIA 60 MG/ML SOSY injection Inject 60 mg into the skin every 6 (six) months.   09/04/2023   Vibegron (GEMTESA) 75 MG TABS Take 75 mg by mouth daily.   12/26/2023 Evening   Scheduled:   atorvastatin   80 mg Oral QHS   cholecalciferol   1,000 Units Oral QPM   diltiazem   120 mg Oral Daily   guaiFENesin   600 mg Oral BID   ipratropium-albuterol   3 mL Nebulization  QID   losartan   100 mg Oral QHS   metoprolol  tartrate  25 mg Oral BID   Infusions:   azithromycin  500 mg (12/28/23 2223)   cefTRIAXone  (ROCEPHIN )  IV 2 g (12/28/23 2134)   heparin  1,200 Units/hr (12/28/23 2025)   PRN:   ASSESSMENT: 78 y.o. female with PMH SVT is presenting with SOB. Patient is not on chronic anticoagulation per chart review. Pharmacy has been consulted to initiate and manage heparin  intravenous infusion.  09/18 0614 HL <0.10, subtherapeutic at 800 u/hr 09/18 1758 HL 0.11 09/19 0349 HL 0.41, therapeutic x 1  Goal(s) of therapy: Heparin  level 0.3 - 0.7 units/mL aPTT 66 - 102 seconds Monitor platelets by anticoagulation protocol: Yes   Baseline anticoagulation labs: Recent Labs    12/27/23 1729 12/27/23 2221 12/28/23 0614 12/29/23 0349  APTT  --  31  --   --   INR  --  1.1 1.1  --   HGB 14.1  --  12.1 12.0  PLT 310  --  278 274     PLAN: Heparin  level is therapeutic x 1. Will continue heparin  infusion at 1200 units/hr. Recheck heparin  level in 8 hours to  confirm. CBC daily while on heparin .   Rankin CANDIE Dills, PharmD, [facility] 12/29/2023 5:23 AM

## 2023-12-30 DIAGNOSIS — J189 Pneumonia, unspecified organism: Secondary | ICD-10-CM | POA: Diagnosis not present

## 2023-12-30 MED ORDER — GUAIFENESIN-DM 100-10 MG/5ML PO SYRP: 15.0000 mL | ORAL_SOLUTION | Freq: Four times a day (QID) | ORAL | 0 refills | Status: DC

## 2023-12-30 MED ORDER — AMOXICILLIN-POT CLAVULANATE 875-125 MG PO TABS: 1.0000 | ORAL_TABLET | Freq: Two times a day (BID) | ORAL | 0 refills | Status: AC

## 2023-12-30 MED ORDER — METOPROLOL TARTRATE 50 MG PO TABS: 50.0000 mg | ORAL_TABLET | Freq: Two times a day (BID) | ORAL | 1 refills | Status: DC

## 2023-12-30 MED ORDER — DILTIAZEM HCL ER COATED BEADS 120 MG PO CP24: 120.0000 mg | ORAL_CAPSULE | Freq: Every day | ORAL | 0 refills | Status: DC

## 2023-12-30 MED ORDER — APIXABAN 5 MG PO TABS: 5.0000 mg | ORAL_TABLET | Freq: Two times a day (BID) | ORAL | 2 refills | Status: DC

## 2023-12-30 NOTE — Plan of Care (Signed)

## 2023-12-30 NOTE — Discharge Summary (Signed)
 Physician Discharge Summary   [name] MRN: [id number] DOB: 1945-07-24  Admit date:     12/27/2023  Discharge date: 12/30/23  Discharge Physician: Leita Blanch   PCP: Alyse Bradley, MD   Recommendations at discharge:    F/u PCP in 1-2 weeks F/u [facility] cardiology Dr End in 1 week for new onset Afib  Discharge Diagnoses: Principal Problem:   Pneumonia of right middle lobe due to infectious organism Active Problems:   NSTEMI (non-ST elevated myocardial infarction) (HCC)   Essential hypertension   Asthma, chronic   Atrial fibrillation (HCC)   Sepsis (HCC)   Elevated troponin   Mixed hyperlipidemia   Weakness  Holly Harrington is a 78 y.o. female with a past medical history of hypertension, SVT, and asthma who presents to the ED complaining of shortness of breath.  Patient reports that she has been feeling generally fatigued with exertion and increasing difficulty breathing over the past month.  She tested positive for COVID-19 about 2 weeks ago, had a cough at that time, but this has since improved.  She has continued to feel short of breath and her difficulty breathing seemed acutely worse while she was out in the garden today.    She went to see her cardiologist, who found her to be in atrial fibrillation. Due to ongoing difficulty breathing, she was referred to the ED for further evaluation.    Chest CT revealed right middle lobe pneumonia with no evidence for PE.    Sepsis due to right middle lobe pneumonia (HCC) history of COVID two weeks ago - continue  IV Rocephin  and Zithromax . - Mucolytic therapy be provided as well as duo nebs q.i.d. and q.4 hours p.r.n. - blood cultures so far negative -- received IV fluids with lactated ringer  -- change antibiotics to oral. Give cough suppressant -- blood cultures negative -- patient overall feels better. Sats stable on room air.   new onset a fib with RVR -- likely in the setting of pneumonia in recent COVID and less --  currently on IV heparin  drip -- continue beta-blockers -- appreciate [facility] cardiology recommendation to switch to oral eliquis  an increased dose of beta blockers   elevated troponin appears demand ischemia in the setting of sepsis  - cardiology consultation with Dr. Gollan -- troponin 680--- 813 -- continue IV heparin  drip-- now discontinued -- patient denies chest pain   Asthma, chronic - prn nebs   Essential hypertension -  continue antihypertensive therapy.  Discharge plan discussed with patient she is agreeable.   Procedures: Family communication : none today  consults : cardiology CODE STATUS: full DVT Prophylaxis : eliquis   Disposition: Home Diet recommendation:  Discharge Diet Orders (From admission, onward)     Start     Ordered   12/30/23 0000  Diet - low sodium heart healthy        12/30/23 0850           Cardiac diet DISCHARGE MEDICATION: Allergies as of 12/30/2023       Reactions   Codeine Nausea And Vomiting, Dermatitis, Other (See Comments)   Other Reaction(s): Other (See Comments)   Hydrocodone    Oxycodone         Medication List     STOP taking these medications    diltiazem  30 MG tablet Commonly known as: Cardizem        TAKE these medications    amoxicillin -clavulanate 875-125 MG tablet Commonly known as: AUGMENTIN  Take 1 tablet by mouth  every 12 (twelve) hours for 3 days.   apixaban  5 MG Tabs tablet Commonly known as: ELIQUIS  Take 1 tablet (5 mg total) by mouth 2 (two) times daily.   Arnuity Ellipta 100 MCG/ACT Aepb Generic drug: Fluticasone Furoate Inhale 1 puff into the lungs daily.   azelastine 0.1 % nasal spray Commonly known as: ASTELIN Place 1 spray into both nostrils 2 (two) times daily. What changed:  when to take this reasons to take this   clobetasol ointment 0.05 % Commonly known as: TEMOVATE Apply 1 Application topically as needed.   diltiazem  120 MG 24 hr capsule Commonly known as: CARDIZEM  CD Take  1 capsule (120 mg total) by mouth daily. Start taking on: December 31, 2023   Gemtesa 75 MG Tabs Generic drug: Vibegron Take 75 mg by mouth daily.   guaiFENesin -dextromethorphan  100-10 MG/5ML syrup Commonly known as: ROBITUSSIN DM Take 15 mLs by mouth 4 (four) times daily.   losartan  100 MG tablet Commonly known as: COZAAR  Take 1 tablet (100 mg total) by mouth daily.   metoprolol  tartrate 50 MG tablet Commonly known as: LOPRESSOR  Take 1 tablet (50 mg total) by mouth 2 (two) times daily.   naproxen  sodium 220 MG tablet Commonly known as: ALEVE  Take 220 mg by mouth as needed.   Prolia 60 MG/ML Sosy injection Generic drug: denosumab Inject 60 mg into the skin every 6 (six) months.   Ventolin  HFA 108 (90 Base) MCG/ACT inhaler Generic drug: albuterol  Inhale into the lungs every 6 (six) hours as needed for wheezing or shortness of breath.   VITAMIN D -3 PO Take 1 capsule by mouth every evening.        Follow-up Information     Alyse Bradley, MD. Schedule an appointment as soon as possible for a visit in 1 week(s).   Specialty: Internal Medicine Why: hospital f/u Contact information: [address] [place] [address] [phone]         Mady Bruckner, MD. Schedule an appointment as soon as possible for a visit in 2 week(s).   Specialty: Cardiology Why: new onset afib Contact information: [address] [place] [address] [phone]                [name]   12/28/23 0700  Weight: 77.6 kg  GENERAL:  78 y.o.-year-old patient with no acute distress.  LUNGS: Normal breath sounds bilaterally, no wheezing CARDIOVASCULAR: S1, S2 normal. No murmur irregular rhythm ABDOMEN: Soft, nontender, nondistended.  EXTREMITIES: No  edema b/l.    NEUROLOGIC: nonfocal  patient is alert and awake, weak   Condition at discharge: fair  The results of significant diagnostics from this  hospitalization (including imaging, microbiology, ancillary and laboratory) are listed below for reference.   Imaging Studies: ECHOCARDIOGRAM COMPLETE Result Date: 12/28/2023    ECHOCARDIOGRAM REPORT   Patient Name:   Holly Harrington Date of Exam: 12/28/2023 Medical Rec #:  [id number]        Height:       62.5 in Accession #:    [id number]       Weight:       171.0 lb Date of Birth:  [birth year]         BSA:          1.799 m Patient Age:    78 years         BP:           170/82 mmHg Patient  Gender: F                HR:           89 bpm. Exam Location:  [facility] Procedure: 2D Echo, Cardiac Doppler and Color Doppler (Both Spectral and Color            Flow Doppler were utilized during procedure). Indications:     Elevated troponin  History:         Patient has prior history of Echocardiogram examinations, most                  recent 01/19/2023. Risk Factors:Hypertension. Valvular heart                  disease.  Sonographer:     Christopher Furnace Referring Phys:  [id number] EVALENE JINNY LUNGER Diagnosing Phys: EVALENE LUNGER MD IMPRESSIONS  1. Left ventricular ejection fraction, by estimation, is 60 to 65%. The left ventricle has normal function. The left ventricle has no regional wall motion abnormalities. There is mild left ventricular hypertrophy. Left ventricular diastolic parameters are indeterminate.  2. Right ventricular systolic function is normal. The right ventricular size is normal.  3. Left atrial size was mild to moderately dilated.  4. The mitral valve is normal in structure. Mild mitral valve regurgitation. No evidence of mitral stenosis. Moderate mitral annular calcification.  5. The aortic valve is normal in structure. Aortic valve regurgitation is mild. Aortic valve sclerosis is present, with no evidence of aortic valve stenosis.  6. The inferior vena cava is normal in size with greater than 50% respiratory variability, suggesting right atrial pressure of 3 mmHg. FINDINGS  Left Ventricle: Left ventricular ejection  fraction, by estimation, is 60 to 65%. The left ventricle has normal function. The left ventricle has no regional wall motion abnormalities. Strain was performed and the global longitudinal strain is indeterminate. The left ventricular internal cavity size was normal in size. There is mild left ventricular hypertrophy. Left ventricular diastolic parameters are indeterminate. Right Ventricle: The right ventricular size is normal. No increase in right ventricular wall thickness. Right ventricular systolic function is normal. Left Atrium: Left atrial size was mild to moderately dilated. Right Atrium: Right atrial size was normal in size. Pericardium: There is no evidence of pericardial effusion. Mitral Valve: The mitral valve is normal in structure. Moderate mitral annular calcification. Mild mitral valve regurgitation. No evidence of mitral valve stenosis. Tricuspid Valve: The tricuspid valve is normal in structure. Tricuspid valve regurgitation is not demonstrated. No evidence of tricuspid stenosis. Aortic Valve: The aortic valve is normal in structure. Aortic valve regurgitation is mild. Aortic valve sclerosis is present, with no evidence of aortic valve stenosis. Aortic valve mean gradient measures 2.0 mmHg. Aortic valve peak gradient measures 3.3  mmHg. Aortic valve area, by VTI measures 3.65 cm. Pulmonic Valve: The pulmonic valve was normal in structure. Pulmonic valve regurgitation is not visualized. No evidence of pulmonic stenosis. Aorta: The aortic root is normal in size and structure. Venous: The inferior vena cava is normal in size with greater than 50% respiratory variability, suggesting right atrial pressure of 3 mmHg. IAS/Shunts: No atrial level shunt detected by color flow Doppler. Additional Comments: 3D was performed not requiring image post processing on an independent workstation and was indeterminate.  LEFT VENTRICLE PLAX 2D LVIDd:         4.80 cm LVIDs:         2.50 cm LV PW:  1.40 cm LV  IVS:        1.00 cm LVOT diam:     2.00 cm LV SV:         61 LV SV Index:   34 LVOT Area:     3.14 cm  RIGHT VENTRICLE RV Basal diam:  3.50 cm RV Mid diam:    3.10 cm LEFT ATRIUM             Index        RIGHT ATRIUM           Index LA diam:        4.10 cm 2.28 cm/m   RA Area:     17.20 cm LA Vol (A2C):   95.2 ml 52.91 ml/m  RA Volume:   43.80 ml  24.34 ml/m LA Vol (A4C):   76.0 ml 42.24 ml/m LA Biplane Vol: 86.5 ml 48.08 ml/m  AORTIC VALVE AV Area (Vmax):    3.15 cm AV Area (Vmean):   2.96 cm AV Area (VTI):     3.65 cm AV Vmax:           91.50 cm/s AV Vmean:          67.150 cm/s AV VTI:            0.168 m AV Peak Grad:      3.3 mmHg AV Mean Grad:      2.0 mmHg LVOT Vmax:         91.80 cm/s LVOT Vmean:        63.200 cm/s LVOT VTI:          0.195 m LVOT/AV VTI ratio: 1.16  AORTA Ao Root diam: 2.70 cm MITRAL VALVE                TRICUSPID VALVE MV Area (PHT): 3.79 cm     TR Peak grad:   42.0 mmHg MV Decel Time: 200 msec     TR Vmax:        324.00 cm/s MV E velocity: 144.00 cm/s                             SHUNTS                             Systemic VTI:  0.20 m                             Systemic Diam: 2.00 cm Evalene Lunger MD Electronically signed by Evalene Lunger MD Signature Date/Time: 12/28/2023/2:57:17 PM    Final    CT Angio Chest PE W/Cm &/Or Wo Cm Result Date: 12/27/2023 CLINICAL DATA:  Increased breath sounds and difficulty breathing, initial encounter EXAM: CT ANGIOGRAPHY CHEST WITH CONTRAST TECHNIQUE: Multidetector CT imaging of the chest was performed using the standard protocol during bolus administration of intravenous contrast. Multiplanar CT image reconstructions and MIPs were obtained to evaluate the vascular anatomy. RADIATION DOSE REDUCTION: This exam was performed according to the departmental dose-optimization program which includes automated exposure control, adjustment of the mA and/or kV according to patient size and/or use of iterative reconstruction technique. CONTRAST:  75mL  OMNIPAQUE  IOHEXOL  350 MG/ML SOLN COMPARISON:  Chest x-ray from earlier in the same day. FINDINGS: Cardiovascular: Thoracic aorta shows atherosclerotic calcifications without aneurysmal dilatation or dissection. No cardiac enlargement is noted. The pulmonary artery  shows a normal branching pattern bilaterally. No intraluminal filling defect to suggest pulmonary embolism is noted. Mild coronary calcifications are seen. Mediastinum/Nodes: Thoracic inlet is within normal limits. No hilar or mediastinal adenopathy is noted. The esophagus as visualized is within normal limits. Lungs/Pleura: Lungs are well aerated bilaterally. Mosaic attenuation changes are noted throughout both lungs consistent with air trapping. Focal right middle lobe infiltrate is noted which corresponds to that seen on the prior exam. Some inspissated material is noted within the lower lobe bronchial tree. No sizable effusion is noted. Upper Abdomen: Visualized upper abdomen is within normal limits. Musculoskeletal: Degenerative changes of the thoracic spine are noted. Review of the MIP images confirms the above findings. IMPRESSION: No evidence of pulmonary emboli. Right middle lobe pneumonia. No other focal abnormality is seen. Electronically Signed   By: Oneil Devonshire M.D.   On: 12/27/2023 22:20   DG Chest 2 View Result Date: 12/27/2023 CLINICAL DATA:  Shortness of breath. EXAM: CHEST - 2 VIEW COMPARISON:  September 26, 2019. FINDINGS: Stable cardiomediastinal silhouette. Ill-defined airspace opacity is noted in right lower lobe concerning for pneumonia. Left lung is clear. Bony thorax is unremarkable. IMPRESSION: Ill-defined right lower lobe airspace opacity is noted concerning for pneumonia. Followup PA and lateral chest X-ray is recommended in 3-4 weeks following trial of antibiotic therapy to ensure resolution and exclude underlying malignancy. Electronically Signed   By: Lynwood Landy Raddle M.D.   On: 12/27/2023 18:19    Microbiology: No results  found for this or any previous visit.  Labs: CBC: Recent Labs  Lab 12/27/23 1729 12/28/23 0614 12/29/23 0349  WBC 10.2 8.9 9.3  HGB 14.1 12.1 12.0  HCT 49.3* 38.5 38.8  MCV 97.4 88.9 90.4  PLT 310 278 274   Basic Metabolic Panel: Recent Labs  Lab 12/27/23 1729 12/28/23 0614  NA 137 138  K 4.7 3.9  CL 104 104  CO2 19* 24  GLUCOSE 93 106*  BUN 17 12  CREATININE 0.71 0.61  CALCIUM  9.6 8.9  MG  --  2.0    Discharge time spent: greater than 30 minutes.  Signed: Leita Blanch, MD [facility] 12/30/2023

## 2024-01-01 ENCOUNTER — Telehealth: Payer: Self-pay | Admitting: Internal Medicine

## 2024-01-01 NOTE — Telephone Encounter (Signed)
 Pt was released from the hospital and her AVS advised her to be seen within two weeks and as of right now she has the first available appt with Dr. Mady I offered appt with the NP/PA and pt refused, she's upset and would like a c/b from the nurse.

## 2024-01-01 NOTE — Telephone Encounter (Signed)
 Called patient - scheduled with Cadence Furth, [place], for 9/23

## 2024-01-02 ENCOUNTER — Ambulatory Visit: Attending: Medical | Admitting: Medical

## 2024-01-02 ENCOUNTER — Encounter: Payer: Self-pay | Admitting: Medical

## 2024-01-02 VITALS — BP 110/50 | HR 60 | Ht 62.0 in | Wt 171.8 lb

## 2024-01-02 DIAGNOSIS — G4733 Obstructive sleep apnea (adult) (pediatric): Secondary | ICD-10-CM | POA: Diagnosis not present

## 2024-01-02 DIAGNOSIS — R7989 Other specified abnormal findings of blood chemistry: Secondary | ICD-10-CM

## 2024-01-02 DIAGNOSIS — I4891 Unspecified atrial fibrillation: Secondary | ICD-10-CM

## 2024-01-02 DIAGNOSIS — I38 Endocarditis, valve unspecified: Secondary | ICD-10-CM

## 2024-01-02 DIAGNOSIS — I10 Essential (primary) hypertension: Secondary | ICD-10-CM

## 2024-01-02 MED ORDER — METOPROLOL TARTRATE 25 MG PO TABS: 25.0000 mg | ORAL_TABLET | Freq: Two times a day (BID) | ORAL | 3 refills | Status: DC

## 2024-01-02 MED ORDER — METOPROLOL TARTRATE 50 MG PO TABS
ORAL_TABLET | ORAL | 0 refills | Status: DC
Start: 2024-01-02 — End: 2024-01-02

## 2024-01-02 NOTE — Patient Instructions (Signed)
 Medication Instructions:  Your physician recommends the following medication changes.  DECREASE: Metoprolol  to 25 mg by mouth twice a day   *If you need a refill on your cardiac medications before your next appointment, please call your pharmacy*  Lab Work: Your provider would like for you to return in 2 weeks prior to cardiovserion to have the following labs drawn: BMP, CBC.   Please go to [facility] [address] ([facility]) [address], [place] [address] You do not need an appointment.  They are open from 8 am- 4:30 pm.  Lunch from 1:00 pm- 2:00 pm You will not need to be fasting.    Testing/Procedures:     Dear Holly Harrington  You are scheduled for a Cardioversion on Friday, October 24 with Dr. Argentina.  Please arrive at the [facility]ascular Center Entrance of [facility], [address], [place] [address] at 6:30 AM (This is 1 hour(s) prior to your procedure time).  Proceed to the Check-In Desk directly inside the entrance.  Procedure Parking: Use the entrance off of the [address] side of the hospital. Turn right upon entering and follow the driveway to parking that is directly in front of the Heart & Vascular Center. There is no valet parking available at this entrance, however there is an awning directly in front of the Heart & Vascular Center for drop off/ pick up for patients.    DIET:  Nothing to eat or drink after midnight except a sip of water with medications (see medication instructions below)  MEDICATION INSTRUCTIONS: !!IF ANY NEW MEDICATIONS ARE STARTED AFTER TODAY, PLEASE NOTIFY YOUR PROVIDER AS SOON AS POSSIBLE!!  FYI: Medications such as Semaglutide (Ozempic, [place]), Tirzepatide (Mounjaro, Zepbound), Dulaglutide (Trulicity), etc (GLP1 agonists) AND Canagliflozin (Invokana), Dapagliflozin (Farxiga), Empagliflozin (Jardiance), Ertugliflozin (Steglatro), Bexagliflozin [employer]) or any combination with one of these drugs such as  Invokamet (Canagliflozin/Metformin), Synjardy (Empagliflozin/Metformin), etc (SGLT2 inhibitors) must be held around the time of a procedure. This is not a comprehensive list of all of these drugs. Please review all of your medications and talk to your provider if you take any one of these. If you are not sure, ask your provider.    Continue taking your anticoagulant (blood thinner): Apixaban  (Eliquis ).  You will need to continue this after your procedure until you are told by your provider that it is safe to stop.    LABS: 2 weeks prior to cardioversion   FYI:  For your safety, and to allow us  to monitor your vital signs accurately during the surgery/procedure we request: If you have artificial nails, gel coating, SNS etc, please have those removed prior to your surgery/procedure. Not having the nail coverings /polish removed may result in cancellation or delay of your surgery/procedure.  Your support person will be asked to wait in the waiting room during your procedure.  It is OK to have someone drop you off and come back when you are ready to be discharged.  You cannot drive after the procedure and will need someone to drive you home.  Bring your insurance cards.  *Special Note: Every effort is made to have your procedure done on time. Occasionally there are emergencies that occur at the hospital that may cause delays. Please be patient if a delay does occur.      Your cardiac CT will be scheduled at one of the below locations:    [facility] [address] [place], [place] [address] [phone]  If scheduled at [facility] or [facility], please arrive 15 mins early for check-in and test prep.  There is spacious parking and easy access to the radiology department from the [facility] entrance. Please enter here and check-in with the desk attendant.   Please follow these instructions carefully  (unless otherwise directed):  An IV will be required for this test and Nitroglycerin  will be given.   On the Night Before the Test: Be sure to Drink plenty of water. Do not consume any caffeinated/decaffeinated beverages or chocolate 12 hours prior to your test. Do not take any antihistamines 12 hours prior to your test.   On the Day of the Test: Drink plenty of water until 1 hour prior to the test. Do not eat any food 1 hour prior to test. You may take your regular medications prior to the test.  Take metoprolol  (Lopressor ) 25 mg  (2 tablet) to equal 50 mg  two hours prior to test. If you take Furosemide /Hydrochlorothiazide/Spironolactone/Chlorthalidone, please HOLD on the morning of the test. Patients who wear a continuous glucose monitor MUST remove the device prior to scanning. FEMALES- please wear underwire-free bra if available, avoid dresses & tight clothing       After the Test: Drink plenty of water. After receiving IV contrast, you may experience a mild flushed feeling. This is normal. On occasion, you may experience a mild rash up to 24 hours after the test. This is not dangerous. If this occurs, you can take Benadryl 25 mg, Zyrtec, Claritin, or Allegra and increase your fluid intake. (Patients taking Tikosyn should avoid Benadryl, and may take Zyrtec, Claritin, or Allegra) If you experience trouble breathing, this can be serious. If it is severe call 911 IMMEDIATELY. If it is mild, please call our office.  We will call to schedule your test 2-4 weeks out understanding that some insurance companies will need an authorization prior to the service being performed.   For more information and frequently asked questions, please visit our website : [email or web address]  For non-scheduling related questions, please contact the cardiac imaging nurse navigator should you have any questions/concerns: Cardiac Imaging Nurse Navigators Direct Office Dial: [phone]    For scheduling needs, including cancellations and rescheduling, please call [place], [phone].    Follow-Up: At [facility], you and your health needs are our priority.  As part of our continuing mission to provide you with exceptional heart care, our providers are all part of one team.  This team includes your primary Cardiologist (physician) and Advanced Practice Providers or APPs (Physician Assistants and Nurse Practitioners) who all work together to provide you with the care you need, when you need it.  Your next appointment:   2 week(s) after 02/02/24  Provider:   Lonni Hanson, MD or Cadence Franchester, PA-C

## 2024-01-02 NOTE — Progress Notes (Unsigned)
 Cardiology Office Note   Date:  01/02/2024  ID:  Holly Harrington, DOB [birth year], MRN [id number] PCP: Alyse Bradley, MD  [facility] Providers Cardiologist:  Lonni Hanson, MD    History of Present Illness Holly Harrington is a 78 y.o. female with a history of recently diagnosed A-fib, PSVT, mitral and aortic regurgitation, hypertension, asthma, osteoporosis, and OSA on CPAP who is being seen for hospital follow-up.  Patient underwent ETT in 11/2019 that showed decreased exercise capacity with hypertensive blood pressure response and was overall intermediate risk due to limited functional capacity.  There were no ischemic EKG changes.  Outpatient cardiac monitoring 11/2019 demonstrated a predominant rhythm of sinus without an average rate of 79 bpm, 21 atrial runs lasting up to 14.1 seconds with a maximum rate of 160 bpm, no sustained arrhythmias or prolonged pauses were noted, patient triggered events corresponded to normal sinus rhythm with PACs.  Echo in October/2021 showed an EF of 55 to 60%, no wall motion abnormalities, mild LVH, grade 2 diastolic dysfunction, normal RV SF and ventricular cavity size, normal past, moderately dilated left atrium, mild to moderate MR, mild to moderate AI.  Echo in October/2022 showed EF 60 to 65%, grade 2 diastolic dysfunction, moderate to severe MR, possible mild mitral valve prolapse of the posterior leaflet, mild to moderate aortic insufficiency, mild to moderate TR.  In follow-up she declined TEE for further evaluation.  Lexiscan  Myoview  in 04/2021 showed no significant ischemia or scar with an EF greater than 65% and was overall low risk.  No significant coronary artery calcification noted.  Echo in 01-2022 showed EF of 60 to 65%, grade 1 diastolic dysfunction, moderate MR, mild AI.  Echo from October/2024 showed EF 60 to 65%, no wall motion abnormalities, mild LVH, moderate MR, mild to moderate AI.  Patient was seen in the office 12/27/2023 for urgent  evaluation of elevated blood pressure, heart rate, and generalized malaise/fatigue.  She was admitted 12/28/23 for sepsis due to PNA (h/o COVID 2 weeks ago), new onset Afib, elevated troponin. Afib rates were mostly controlled. She was started on Cardizem  and Lopressor . For CHADSVASC of 5 she was started on Eliquis  with plan for outpatient cardioversion.  Troponin was elevated to the 800s.  She never had any chest pain.  Suspected supply/demand mismatch in the setting of multiple acute issues.  Echo showed preserved pulm function and normal wall motion.  Plan was for outpatient ischemic testing.  Today, the patient reports she has been she has been tired. She is in afib with HR of 60bpm.  She denies chest pain or shortness of breath.  She has no lower leg edema on exam.  Discussed multiple options for A-fib.  Patient reports compliance with her CPAP. She reports compliance with Eliquis .   Studies Reviewed EKG Interpretation Date/Time:  Tuesday January 02 2024 14:47:27 EDT Ventricular Rate:  60 PR Interval:    QRS Duration:  94 QT Interval:  406 QTC Calculation: 406 R Axis:   0  Text Interpretation: Atrial fibrillation with a competing junctional pacemaker Septal infarct (cited on or before 10-May-2023) When compared with ECG of 27-Dec-2023 17:33, Vent. rate has decreased BY  31 BPM Questionable change in initial forces of Septal leads Confirmed by Franchester, Tranquilino Fischler ([id number]) on 01/02/2024 3:22:45 PM    Echo 12/2023  1. Left ventricular ejection fraction, by estimation, is 60 to 65%. The  left ventricle has normal function. The left ventricle has no regional  wall motion abnormalities. There is  mild left ventricular hypertrophy.  Left ventricular diastolic parameters  are indeterminate.   2. Right ventricular systolic function is normal. The right ventricular  size is normal.   3. Left atrial size was mild to moderately dilated.   4. The mitral valve is normal in structure. Mild mitral valve   regurgitation. No evidence of mitral stenosis. Moderate mitral annular  calcification.   5. The aortic valve is normal in structure. Aortic valve regurgitation is  mild. Aortic valve sclerosis is present, with no evidence of aortic valve  stenosis.   6. The inferior vena cava is normal in size with greater than 50%  respiratory variability, suggesting right atrial pressure of 3 mmHg.   Echo 01/2023  1. Left ventricular ejection fraction, by estimation, is 60 to 65%. The  left ventricle has normal function. The left ventricle has no regional  wall motion abnormalities. There is mild left ventricular hypertrophy.  Left ventricular diastolic parameters  are indeterminate.   2. Right ventricular systolic function is normal. The right ventricular  size is normal. There is normal pulmonary artery systolic pressure.   3. Left atrial size was moderately dilated.   4. The mitral valve is abnormal. Moderate mitral valve regurgitation. No  evidence of mitral stenosis. Moderate mitral annular calcification.   5. The aortic valve is normal in structure. Aortic valve regurgitation is  mild to moderate. Aortic valve sclerosis/calcification is present, without  any evidence of aortic stenosis.   6. Aortic dilatation noted. There is borderline dilatation of the  ascending aorta, measuring 38 mm.   7. The inferior vena cava is normal in size with greater than 50%  respiratory variability, suggesting right atrial pressure of 3 mmHg.    Echo 01/2022 1. Left ventricular ejection fraction, by estimation, is 60 to 65%. The  left ventricle has normal function. The left ventricle has no regional  wall motion abnormalities. Left ventricular diastolic parameters are  consistent with Grade II diastolic  dysfunction (pseudonormalization). The average left ventricular global  longitudinal strain is -13.1 %.   2. Right ventricular systolic function is normal. The right ventricular  size is normal. Tricuspid  regurgitation signal is inadequate for assessing  PA pressure.   3. Left atrial size was moderately dilated.   4. The mitral valve is normal in structure. Moderate mitral valve  regurgitation. No evidence of mitral stenosis.   5. The aortic valve was not well visualized. Aortic valve regurgitation  is mild. Aortic valve sclerosis/calcification is present, without any  evidence of aortic stenosis.   6. There is borderline dilatation of the ascending aorta, measuring 37  mm.   7. The inferior vena cava is normal in size with greater than 50%  respiratory variability, suggesting right atrial pressure of 3 mmHg.   Myoview  lexiscan  04/2021   Normal pharmacologic myocardial perfusion stress test without evidence of significant ischemia or scar.   Left ventricular systolic function is normal (LVEF > 65%).   Mitral annular calcification is present.  There is no significant coronary artery calcification.  Aortic atherosclerosis is noted.   This is a low risk study.  Echo 01/2021 1. Left ventricular ejection fraction, by estimation, is 60 to 65%. The  left ventricle has normal function. The left ventricle has no regional  wall motion abnormalities. Left ventricular diastolic parameters are  consistent with Grade II diastolic  dysfunction (pseudonormalization). The average left ventricular global  longitudinal strain is -16.2 %.   2. Right ventricular systolic function is normal. The  right ventricular  size is normal. There is mildly elevated pulmonary artery systolic  pressure. The estimated right ventricular systolic pressure is 38.2 mmHg.   3. The mitral valve is normal in structure.   4. Moderate to Severe mitral valve regurgitation (possibly severe). No  evidence of mitral stenosis. Possible mild prolapse of the posterior  leaflet.   5. The aortic valve is normal in structure. Aortic valve regurgitation is  mmild to moderate, possibly moderate. No aortic stenosis is present.   6. aortic  root, measuring 36 mm. ascending aorta, measuring 36 mm.   7. The inferior vena cava is normal in size with greater than 50%  respiratory variability, suggesting right atrial pressure of 3 mmHg.   8. Tricuspid valve regurgitation is moderate to severe.   Comparison(s): LVEF 55-60%, Mild-mod MR, Mild-Mod AI.      Physical Exam VS:  BP (!) 110/50 (BP Location: Left Arm, Patient Position: Sitting, Cuff Size: Normal)   Pulse 60   Ht 5' 2 (1.575 m)   Wt 171 lb 12.8 oz (77.9 kg)   SpO2 96%   BMI 31.42 kg/m        Wt Readings from Last 3 Encounters:  01/02/24 171 lb 12.8 oz (77.9 kg)  12/28/23 171 lb (77.6 kg)  12/27/23 171 lb 9.6 oz (77.8 kg)    GEN: Well nourished, well developed in no acute distress NECK: No JVD; No carotid bruits CARDIAC: RRR, no murmurs, rubs, gallops RESPIRATORY:  Clear to auscultation without rales, wheezing or rhonchi  ABDOMEN: Soft, non-tender, non-distended EXTREMITIES:  No edema; No deformity   ASSESSMENT AND PLAN  Recently diagnosed Afib Recent hospitalization for sepsis due to pneumonia and recent COVID illness found to have new onset A-fib and elevated troponin.  A-fib rates were mostly controlled with brief paroxysmals into the 120s.  She was discharged 12/30/23 with Cardizem  120 mg daily, Lopressor  50 mg twice daily, and Eliquis  5 mg twice daily.  CHA2DS2-VASc of 5.  Today patient remains in A-fib with a heart rate of 60 bpm.  Diastolic is 50 today.  I will decrease Lopressor  to 25 mg twice daily.  I will set her up for cardioversion on her after January 29, 2024.  We will see her 2 weeks after cardioversion.  Elevated troponin Elevated troponin in the setting of multiple acute issues, likely supply/demand mismatch.  She was treated with IV heparin  for 48 hours.  Echo showed preserved LV EF and normal wall motion.  Patient never had any chest pain.  I will order a cardiac CTA.  Mitral and aortic regurgitation Stable on most recent echo.  Can repeat echo  every 1 to 3 years.  OSA Patient reports compliance with CPAP.  HTN BP is 110/50. Decrease lopressor  as above. Continue Losartan  50mg  daily, Cardizem  120mg  daily and Lopressor  25mg BID.      Informed Consent   Shared Decision Making/Informed Consent{ All outpatient stress tests require an informed consent ([id number]) ATTESTATION ORDER       :[id number]} The risks (stroke, cardiac arrhythmias rarely resulting in the need for a temporary or permanent pacemaker, skin irritation or burns and complications associated with conscious sedation including aspiration, arrhythmia, respiratory failure and death), benefits (restoration of normal sinus rhythm) and alternatives of a direct current cardioversion were explained in detail to Ms. Hugh and she agrees to proceed.       Dispo: Follow-up in 6 weeks  Signed, Anecia Nusbaum VEAR Fishman, PA-C

## 2024-01-02 NOTE — Progress Notes (Unsigned)
 Cardiology Office Note   Date:  01/02/2024  ID:  KENEDIE DIROCCO, DOB 12-11-1945, MRN [id number] PCP: Alyse Bradley, MD  [facility] Providers Cardiologist:  Lonni Hanson, MD    History of Present Illness Holly Harrington is a 78 y.o. female with a history of recently diagnosed A-fib, PSVT, mitral and aortic regurgitation, hypertension, asthma, osteoporosis, and OSA on CPAP who is being seen for hospital follow-up.  Patient underwent ETT in 11/2019 that showed decreased exercise capacity with hypertensive blood pressure response and was overall intermediate risk due to limited functional capacity.  There were no ischemic EKG changes.  Outpatient cardiac monitoring 11/2019 demonstrated a predominant rhythm of sinus without an average rate of 79 bpm, 21 atrial runs lasting up to 14.1 seconds with a maximum rate of 160 bpm, no sustained arrhythmias or prolonged pauses were noted, patient triggered events corresponded to normal sinus rhythm with PACs.  Echo in October/2021 showed an EF of 55 to 60%, no wall motion abnormalities, mild LVH, grade 2 diastolic dysfunction, normal RV SF and ventricular cavity size, normal past, moderately dilated left atrium, mild to moderate MR, mild to moderate AI.  Echo in October/2022 showed EF 60 to 65%, grade 2 diastolic dysfunction, moderate to severe MR, possible mild mitral valve prolapse of the posterior leaflet, mild to moderate aortic insufficiency, mild to moderate TR.  In follow-up she declined TEE for further evaluation.  Lexiscan  Myoview  in 04/2021 showed no significant ischemia or scar with an EF greater than 65% and was overall low risk.  No significant coronary artery calcification noted.  Echo in 01-2022 showed EF of 60 to 65%, grade 1 diastolic dysfunction, moderate MR, mild AI.  Echo from October/2024 showed EF 60 to 65%, no wall motion abnormalities, mild LVH, moderate MR, mild to moderate AI.  Patient was seen in the office 12/27/2023 for urgent  evaluation of elevated blood pressure, heart rate, and generalized malaise/fatigue.  She was admitted 12/28/23 for sepsis due to PNA (h/o COVID 2 weeks ago), new onset Afib, elevated troponin. Afib rates were mostly controlled. She was started on Cardizem  and Lopressor . For CHADSVASC of 5 she was started on Eliquis  with plan for outpatient cardioversion.  Troponin was elevated to the 800s.  She never had any chest pain.  Suspected supply/demand mismatch in the setting of multiple acute issues.  Echo showed preserved pulm function and normal wall motion.  Plan was for outpatient ischemic testing.  Today, the patient reports she has been she has been tired. She is in afib with HR of 60bpm.  She denies chest pain or shortness of breath.  She has no lower leg edema on exam.  Discussed multiple options for A-fib.  Patient reports compliance with her CPAP.  Decreasing lorepssor 25mg  daily Osa on CPAP  Elevated troponin  Studies Reviewed EKG Interpretation Date/Time:  Tuesday January 02 2024 14:47:27 EDT Ventricular Rate:  60 PR Interval:    QRS Duration:  94 QT Interval:  406 QTC Calculation: 406 R Axis:   0  Text Interpretation: Atrial fibrillation with a competing junctional pacemaker Septal infarct (cited on or before 10-May-2023) When compared with ECG of 27-Dec-2023 17:33, Vent. rate has decreased BY  31 BPM Questionable change in initial forces of Septal leads Confirmed by Franchester, Weylyn Ricciuti ([id number]) on 01/02/2024 3:22:45 PM    *** Risk Assessment/Calculations {Does this patient have ATRIAL FIBRILLATION?[phone]}         Physical Exam VS:  BP (!) 110/50 (BP Location: Left Arm,  Patient Position: Sitting, Cuff Size: Normal)   Pulse 60   Ht 5' 2 (1.575 m)   Wt 171 lb 12.8 oz (77.9 kg)   SpO2 96%   BMI 31.42 kg/m        Wt Readings from Last 3 Encounters:  01/02/24 171 lb 12.8 oz (77.9 kg)  12/28/23 171 lb (77.6 kg)  12/27/23 171 lb 9.6 oz (77.8 kg)    GEN: Well nourished, well  developed in no acute distress NECK: No JVD; No carotid bruits CARDIAC: RRR, no murmurs, rubs, gallops RESPIRATORY:  Clear to auscultation without rales, wheezing or rhonchi  ABDOMEN: Soft, non-tender, non-distended EXTREMITIES:  No edema; No deformity   ASSESSMENT AND PLAN  Paroxysmal Afib Recent hospitalization for sepsis due to pneumonia and recent COVID illness found to have new onset A-fib and elevated troponin.  A-fib rates were mostly controlled with brief paroxysmals into the 120s.  She was discharged with Cardizem  120 mg daily, Lopressor  50 mg twice daily, and Eliquis  5 mg twice daily.  CHA2DS2-VASc of 5.  Today patient remains in A-fib with a heart rate of 60 bpm.  Diastolic is 50 today.  I will decrease Lopressor  to 25 mg twice daily.  I will set her up for cardioversion on her after January 29, 2024.  We will see her 2 weeks after cardioversion.  Elevated troponin Elevated troponin in the setting of multiple acute issues, likely supply/demand mismatch.  She was treated with IV heparin  for 48 hours.  Echo showed preserved LV EF and normal wall motion.  Patient never had any chest pain.  I will order a cardiac CTA.  Mitral and aortic regurgitation Stable on most recent echo.  Can repeat echo every 1 to 3 years.  OSA Patient reports compliance with CPAP.      Informed Consent   Shared Decision Making/Informed Consent{ All outpatient stress tests require an informed consent ([id number]) ATTESTATION ORDER       :[id number]} The risks (stroke, cardiac arrhythmias rarely resulting in the need for a temporary or permanent pacemaker, skin irritation or burns and complications associated with conscious sedation including aspiration, arrhythmia, respiratory failure and death), benefits (restoration of normal sinus rhythm) and alternatives of a direct current cardioversion were explained in detail to Ms. Skidmore and she agrees to proceed.       Dispo: Follow-up in 6 weeks  Signed, Russell LOISE Gully, RN

## 2024-01-15 ENCOUNTER — Ambulatory Visit: Admission: RE | Admit: 2024-01-15 | Source: Ambulatory Visit

## 2024-01-19 ENCOUNTER — Ambulatory Visit
Admission: RE | Admit: 2024-01-19 | Discharge: 2024-01-19 | Disposition: A | Discharge status: Home / Self Care | Source: Ambulatory Visit | Attending: Student | Admitting: Student

## 2024-01-19 ENCOUNTER — Other Ambulatory Visit: Payer: Self-pay | Admitting: Student

## 2024-01-19 ENCOUNTER — Ambulatory Visit
Admission: RE | Admit: 2024-01-19 | Discharge: 2024-01-19 | Disposition: A | Discharge status: Home / Self Care | Source: Ambulatory Visit | Attending: Medical | Admitting: Medical

## 2024-01-19 ENCOUNTER — Other Ambulatory Visit
Admission: RE | Admit: 2024-01-19 | Discharge: 2024-01-19 | Disposition: A | Discharge status: Home / Self Care | Source: Ambulatory Visit | Attending: Medical | Admitting: Medical

## 2024-01-19 DIAGNOSIS — J189 Pneumonia, unspecified organism: Secondary | ICD-10-CM

## 2024-01-19 LAB — CBC
HCT: 38.4 % (ref 36.0–46.0)
Hemoglobin: 11.7 g/dL — ABNORMAL LOW (ref 12.0–15.0)
MCH: 27.8 pg (ref 26.0–34.0)
MCHC: 30.5 g/dL (ref 30.0–36.0)
MCV: 91.2 fL (ref 80.0–100.0)
Platelets: 189 K/uL (ref 150–400)
RBC: 4.21 MIL/uL (ref 3.87–5.11)
RDW: 14.8 % (ref 11.5–15.5)
WBC: 9.1 K/uL (ref 4.0–10.5)
nRBC: 0 % (ref 0.0–0.2)

## 2024-01-19 LAB — BASIC METABOLIC PANEL WITH GFR
Anion gap: 11 (ref 5–15)
BUN: 22 mg/dL (ref 8–23)
CO2: 25 mmol/L (ref 22–32)
Calcium: 9.2 mg/dL (ref 8.9–10.3)
Chloride: 105 mmol/L (ref 98–111)
Creatinine, Ser: 0.72 mg/dL (ref 0.44–1.00)
GFR, Estimated: >60 mL/min (ref >60)
Glucose, Bld: 99 mg/dL (ref 70–99)
Potassium: 4.4 mmol/L (ref 3.5–5.1)
Sodium: 141 mmol/L (ref 135–145)

## 2024-01-23 ENCOUNTER — Ambulatory Visit: Payer: Self-pay | Admitting: Medical

## 2024-01-31 ENCOUNTER — Other Ambulatory Visit: Payer: Self-pay

## 2024-01-31 MED ORDER — DILTIAZEM HCL ER COATED BEADS 120 MG PO CP24: 120.0000 mg | ORAL_CAPSULE | Freq: Every day | ORAL | 2 refills | Status: AC

## 2024-02-01 MED ORDER — SODIUM CHLORIDE 0.9 % IV SOLN: INTRAVENOUS | Status: DC

## 2024-02-02 ENCOUNTER — Ambulatory Visit: Admission: RE | Admit: 2024-02-02 | Discharge: 2024-02-02 | Disposition: A | Discharge status: Home / Self Care

## 2024-02-02 ENCOUNTER — Ambulatory Visit: Payer: Self-pay | Admitting: Anesthesiology

## 2024-02-02 ENCOUNTER — Encounter: Admission: RE | Disposition: A | Discharge status: Home / Self Care | Payer: Self-pay | Source: Home / Self Care

## 2024-02-02 ENCOUNTER — Ambulatory Visit: Admitting: Internal Medicine

## 2024-02-02 DIAGNOSIS — I10 Essential (primary) hypertension: Secondary | ICD-10-CM | POA: Insufficient documentation

## 2024-02-02 DIAGNOSIS — I4891 Unspecified atrial fibrillation: Secondary | ICD-10-CM | POA: Diagnosis present

## 2024-02-02 DIAGNOSIS — I252 Old myocardial infarction: Secondary | ICD-10-CM | POA: Diagnosis not present

## 2024-02-02 DIAGNOSIS — J45909 Unspecified asthma, uncomplicated: Secondary | ICD-10-CM | POA: Diagnosis not present

## 2024-02-02 DIAGNOSIS — G4730 Sleep apnea, unspecified: Secondary | ICD-10-CM | POA: Insufficient documentation

## 2024-02-02 DIAGNOSIS — I4819 Other persistent atrial fibrillation: Secondary | ICD-10-CM | POA: Insufficient documentation

## 2024-02-02 HISTORY — PX: CARDIOVERSION: SHX1299

## 2024-02-02 SURGERY — CARDIOVERSION
Anesthesia: General

## 2024-02-02 MED ORDER — DILTIAZEM HCL ER COATED BEADS 120 MG PO CP24: 120.0000 mg | ORAL_CAPSULE | Freq: Every day | ORAL | 3 refills | Status: AC

## 2024-02-02 MED ORDER — PROPOFOL 10 MG/ML IV BOLUS
INTRAVENOUS | Status: DC | PRN
  Administered 2024-02-02: 40 mg via INTRAVENOUS

## 2024-02-02 MED ORDER — PROPOFOL 10 MG/ML IV BOLUS
INTRAVENOUS | Status: AC
  Filled 2024-02-02: qty 20

## 2024-02-02 NOTE — Anesthesia Postprocedure Evaluation (Signed)
 Anesthesia Post Note  [name]  Procedure(s) Performed: CARDIOVERSION  Patient location during evaluation: Specials Recovery Anesthesia Type: General Level of consciousness: awake and alert Pain management: pain level controlled Vital Signs Assessment: post-procedure vital signs reviewed and stable Respiratory status: spontaneous breathing, nonlabored ventilation, respiratory function stable and patient connected to nasal cannula oxygen Cardiovascular status: blood pressure returned to baseline and stable Postop Assessment: no apparent nausea or vomiting Anesthetic complications: no   No notable events documented.   Last Vitals:  Vitals:   02/02/24 0755 02/02/24 0810  BP: (!) 133/56 (!) 128/57  Pulse: (!) 59 (!) 59  Resp: 14 10  Temp:    SpO2: 96% 97%    Last Pain:  Vitals:   02/02/24 0810  TempSrc:   PainSc: 0-No pain                 Fairy POUR [place]

## 2024-02-02 NOTE — Anesthesia Preprocedure Evaluation (Signed)
 Anesthesia Evaluation  Patient identified by MRN, date of birth, ID band Patient awake    Reviewed: Allergy & Precautions, NPO status , Patient's Chart, lab work & pertinent test results  History of Anesthesia Complications Negative for: history of anesthetic complications  Airway Mallampati: III  TM Distance: <3 FB Neck ROM: full    Dental  (+) Chipped   Pulmonary shortness of breath, asthma , sleep apnea    Pulmonary exam normal        Cardiovascular Exercise Tolerance: Good hypertension, (-) angina + Past MI   Rhythm:irregular Rate:Normal     Neuro/Psych negative neurological ROS  negative psych ROS   GI/Hepatic negative GI ROS, Neg liver ROS,neg GERD  ,,  Endo/Other  negative endocrine ROS    Renal/GU negative Renal ROS  negative genitourinary   Musculoskeletal   Abdominal   Peds  Hematology negative hematology ROS (+)   Anesthesia Other Findings Past Medical History: No date: Asthma No date: Breast lump No date: Corneal ulcer No date: Fibroid No date: Hypertension No date: Obstructive sleep apnea No date: Osteopenia No date: Osteoporosis No date: PSVT (paroxysmal supraventricular tachycardia) 01/2020: Valvular heart disease     Comment:  Moderate-severe mitral regurgitation; mild aortic               regurgitation  Past Surgical History: No date: ANTERIOR AND POSTERIOR VAGINAL REPAIR No date: VAGINAL HYSTERECTOMY  BMI    Body Mass Index: 31.09 kg/m      Reproductive/Obstetrics negative OB ROS                              Anesthesia Physical Anesthesia Plan  ASA: 3  Anesthesia Plan: General   Post-op Pain Management:    Induction: Intravenous  PONV Risk Score and Plan: Propofol infusion and TIVA  Airway Management Planned: Natural Airway and Nasal Cannula  Additional Equipment:   Intra-op Plan:   Post-operative Plan:   Informed Consent: I have  reviewed the patients History and Physical, chart, labs and discussed the procedure including the risks, benefits and alternatives for the proposed anesthesia with the patient or authorized representative who has indicated his/her understanding and acceptance.     Dental Advisory Given  Plan Discussed with: Anesthesiologist, CRNA and Surgeon  Anesthesia Plan Comments: (Patient consented for risks of anesthesia including but not limited to:  - adverse reactions to medications - risk of airway placement if required - damage to eyes, teeth, lips or other oral mucosa - nerve damage due to positioning  - sore throat or hoarseness - Damage to heart, brain, nerves, lungs, other parts of body or loss of life  Patient voiced understanding and assent.)        Anesthesia Quick Evaluation

## 2024-02-02 NOTE — CV Procedure (Signed)
   DIRECT CURRENT CARDIOVERSION  NAME:  Holly Harrington    MRN: [id number] DOB:  1945/08/03    ADMIT DATE: 02/02/2024  INDICATIONS: Persistent atrial fibrillation  PROCEDURE:   Informed consent was obtained prior to the procedure. The risks, benefits and alternatives for the procedure were discussed and the patient comprehended these risks.  Risks include, but are not limited to, cough, sore throat, vomiting, nausea, somnolence, low blood pressure, aspiration, pneumonia, infection, and death.    After a procedural time-out, the patient was sedated by the anesthesia service. Once an appropriate level of sedation was confirmed, the patient was cardioverted x 1 with 200J of biphasic synchronized energy.  The patient converted to NSR with atrial bigeminy.  There were no apparent complications.  The patient had normal neuro status and respiratory status post procedure with vitals stable as recorded elsewhere.  Adequate airway was maintained throughout and vital signs monitored per protocol.  COMPLICATIONS:    Complications: No complications Patient tolerated procedure well.  Signed, Caron Poser, MD

## 2024-02-02 NOTE — H&P (Signed)
 History and Physical Interval Note:  02/02/2024 6:45 AM  Holly Harrington  has presented today for DCCV, with the diagnosis of persistent atrial fibrillation.  The various methods of treatment have been discussed with the patient and family. After consideration of risks, benefits and other options for treatment, the patient has consented to  Procedure(s): CARDIOVERSION (N/A) as a therapeutic intervention.  The patient's history has been reviewed, patient examined, no change in status, stable for surgery.  I have reviewed the patient's chart and labs.  Questions were answered to the patient's satisfaction.     Caron [facility]

## 2024-02-02 NOTE — Transfer of Care (Signed)
 Immediate Anesthesia Transfer of Care Note  [name]  Procedure(s) Performed: CARDIOVERSION  Patient Location: [facility]  Anesthesia Type:General  Level of Consciousness: drowsy  Airway & Oxygen Therapy: Patient Spontanous Breathing  Post-op Assessment: Report given to RN and Post -op Vital signs reviewed and stable  Post vital signs: Reviewed and stable  Last Vitals:  Vitals Value Taken Time  BP 143/57 02/02/24 07:36  Temp    Pulse 30 02/02/24 07:37  Resp 8 02/02/24 07:37  SpO2 97 % 02/02/24 07:37    Last Pain:  Vitals:   02/02/24 9347  TempSrc: Oral         Complications: No notable events documented.

## 2024-02-09 ENCOUNTER — Telehealth (HOSPITAL_COMMUNITY): Payer: Self-pay | Admitting: Emergency Medicine

## 2024-02-09 NOTE — Telephone Encounter (Signed)
 Attempted to call patient regarding upcoming cardiac CT appointment. Left message on voicemail with name and callback number Rockwell Alexandria RN Navigator Cardiac Imaging [facility] [phone] Office [phone] Cell

## 2024-02-10 ENCOUNTER — Other Ambulatory Visit: Payer: Self-pay | Admitting: Internal Medicine

## 2024-02-12 ENCOUNTER — Ambulatory Visit
Admission: RE | Admit: 2024-02-12 | Discharge: 2024-02-12 | Disposition: A | Discharge status: Home / Self Care | Source: Ambulatory Visit | Attending: Medical | Admitting: Medical

## 2024-02-12 DIAGNOSIS — I77810 Thoracic aortic ectasia: Secondary | ICD-10-CM | POA: Diagnosis not present

## 2024-02-12 DIAGNOSIS — I517 Cardiomegaly: Secondary | ICD-10-CM | POA: Diagnosis not present

## 2024-02-12 DIAGNOSIS — I2510 Atherosclerotic heart disease of native coronary artery without angina pectoris: Secondary | ICD-10-CM | POA: Diagnosis not present

## 2024-02-12 DIAGNOSIS — I4891 Unspecified atrial fibrillation: Secondary | ICD-10-CM | POA: Diagnosis not present

## 2024-02-12 DIAGNOSIS — R7989 Other specified abnormal findings of blood chemistry: Secondary | ICD-10-CM | POA: Diagnosis present

## 2024-02-12 MED ORDER — IOHEXOL 350 MG/ML SOLN
100.0000 mL | Freq: Once | INTRAVENOUS | Status: AC | PRN
  Administered 2024-02-12: 100 mL via INTRAVENOUS

## 2024-02-12 MED ORDER — NITROGLYCERIN 0.4 MG SL SUBL
0.8000 mg | SUBLINGUAL_TABLET | Freq: Once | SUBLINGUAL | Status: AC
  Administered 2024-02-12: 0.8 mg via SUBLINGUAL
  Filled 2024-02-12: qty 25

## 2024-02-12 NOTE — Progress Notes (Signed)
 Patient tolerated procedure well. Ambulate w/o difficulty. Denies any lightheadedness or being dizzy. Pt denies any pain at this time. Sitting in chair. Pt is encouraged to drink additional water throughout the day and reason explained to patient. Patient verbalized understanding and all questions answered. ABC intact. No further needs at this time. Discharge from procedure area w/o issues.

## 2024-02-13 ENCOUNTER — Ambulatory Visit: Payer: Self-pay | Admitting: Medical

## 2024-02-16 ENCOUNTER — Encounter: Payer: Self-pay | Admitting: Internal Medicine

## 2024-02-16 ENCOUNTER — Ambulatory Visit

## 2024-02-16 ENCOUNTER — Ambulatory Visit: Attending: Internal Medicine | Admitting: Internal Medicine

## 2024-02-16 VITALS — BP 124/58 | HR 70 | Ht 62.0 in | Wt 174.8 lb

## 2024-02-16 DIAGNOSIS — I4891 Unspecified atrial fibrillation: Secondary | ICD-10-CM

## 2024-02-16 DIAGNOSIS — I10 Essential (primary) hypertension: Secondary | ICD-10-CM

## 2024-02-16 DIAGNOSIS — I4892 Unspecified atrial flutter: Secondary | ICD-10-CM

## 2024-02-16 DIAGNOSIS — I5032 Chronic diastolic (congestive) heart failure: Secondary | ICD-10-CM

## 2024-02-16 DIAGNOSIS — D649 Anemia, unspecified: Secondary | ICD-10-CM | POA: Diagnosis not present

## 2024-02-16 DIAGNOSIS — I38 Endocarditis, valve unspecified: Secondary | ICD-10-CM

## 2024-02-16 DIAGNOSIS — Z79899 Other long term (current) drug therapy: Secondary | ICD-10-CM

## 2024-02-16 NOTE — Patient Instructions (Signed)
 Medication Instructions:  Your physician recommends that you continue on your current medications as directed. Please refer to the Current Medication list given to you today.    *If you need a refill on your cardiac medications before your next appointment, please call your pharmacy*  Lab Work: Your provider would like for you to have following labs drawn today CBC.     Testing/Procedures: GEOFFRY HEWS- Long Term Monitor Instructions  Your physician has requested you wear a ZIO patch monitor for 14 days.  This is a single patch monitor. [employer] supplies one patch monitor per enrollment. Additional stickers are not available. Please do not apply patch if you will be having a Nuclear Stress Test, Echocardiogram, Cardiac CT, MRI, or Chest Xray during the period you would be wearing the monitor. The patch cannot be worn during these tests. You cannot remove and re-apply the ZIO XT patch monitor.  Your ZIO patch monitor will be mailed 3 day USPS to your address on file. It may take 3-5 days to receive your monitor after you have been enrolled. Once you have received your monitor, please review the enclosed instructions. Your monitor has already been registered assigning a specific monitor serial number to you.  Billing and Patient Assistance Program Information  We have supplied [employer] with any of your insurance information on file for billing purposes.  [employer] offers a sliding scale Patient Assistance Program for patients that do not have insurance, or whose insurance does not completely cover the cost of the ZIO monitor.  You must apply for the Patient Assistance Program to qualify for this discounted rate.  To apply, please call [employer] at [phone], select option 4, select option 2, ask to apply for Patient Assistance Program. Meredeth will ask your household income, and how many people are in your household. They will quote your out-of-pocket cost based on that information. [employer] will also be able  to set up a 84-month, interest-free payment plan if needed.  Applying the monitor   Shave hair from upper left chest.  Hold abrader disc by orange tab. Rub abrader in 40 strokes over the upper left chest as indicated in your monitor instructions.  Clean area with 4 enclosed alcohol pads. Let dry.  Apply patch as indicated in monitor instructions. Patch will be placed under collarbone on left side of chest with arrow pointing upward.  Rub patch adhesive wings for 2 minutes. Remove white label marked 1. Remove the white label marked 2. Rub patch adhesive wings for 2 additional minutes.  While looking in a mirror, press and release button in center of patch. A small green light will flash 3-4 times. This will be your only indicator that the monitor has been turned on.  Do not shower for the first 24 hours. You may shower after the first 24 hours.  Press the button if you feel a symptom. You will hear a small click. Record Date, Time and Symptom in the Patient Logbook.  When you are ready to remove the patch, follow instructions on the last 2 pages of Patient Logbook.  Stick patch monitor into the tabs at the bottom of the return box.  Place Patient Logbook in the blue and white box. Use locking tab on box and tape box closed securely. The blue and white box has prepaid postage on it. Please place it in the mailbox as soon as possible. Your physician should have your test results approximately 7-14 days after the monitor has been mailed back to [facility].  Call [facility] Customer Care at [phone] if you have questions regarding your ZIO XT patch monitor.  Call them immediately if you see an orange light blinking on your monitor.  If your monitor falls off in less than 4 days, contact our Monitor department at [phone].  If your monitor becomes loose or falls off after 4 days call [employer] at [phone] for suggestions on securing your monitor.   Follow-Up: At [facility], you and your health needs are our priority.  As part of our continuing mission to provide you with exceptional heart care, our providers are all part of one team.  This team includes your primary Cardiologist (physician) and Advanced Practice Providers or APPs (Physician Assistants and Nurse Practitioners) who all work together to provide you with the care you need, when you need it.  Your next appointment:   3 month(s)  Provider:   You may see Lonni Hanson, MD or one of the following Advanced Practice Providers on your designated Care Team:   Lonni Meager, NP Lesley Maffucci, PA-C Bernardino Bring, PA-C Cadence Bivins, PA-C Tylene Lunch, NP Barnie Hila, NP

## 2024-02-16 NOTE — Progress Notes (Signed)
 Cardiology Office Note:  .   Date:  02/16/2024  ID:  Holly Harrington, DOB 1946/04/11, MRN [id number] PCP: Alyse Bradley, MD  [facility] Providers Cardiologist:  Lonni Hanson, MD     History of Present Illness: .   Holly Harrington is a 78 y.o. female with history of persistent atrial fibrillation, PSVT, valvular heart disease (mitral and aortic regurgitation), hypertension, obstructive sleep apnea on CPAP, hypertension, asthma, and osteoporosis, who presents for follow-up of atrial fibrillation and valvular heart disease.  She initially presented to us  in September with elevated heart rates and generalized fatigue/malaise.  She was noted to be in atrial fibrillation at that time, which was new for her.  She was referred to the ER and also found to have right middle lobe pneumonia.  She remained in atrial fibrillation at follow-up despite treatment of her pneumonia and underwent successful cardioversion on 02/02/2024.  She underwent coronary CTA earlier this week given mild troponin elevation in the setting of her new atrial fibrillation and pneumonia in September.  This showed minimal, nonobstructive CAD.  Today, Holly Harrington is feeling better.  She notes a single episode of sharp chest pain about a week after leaving the hospital.  She has noticed more energy since undergoing DCCV, though she felt her pulse once afterwards and was concerned that it seemed irregulary.  She still notes some fatigue and exertional dyspnea.  She denies palpitations but experienced random wooziness but no frank lightheadedness or syncope/presyncope.  She has been compliant with her medications, including apixaban ; she denies bleeding but is concerned about the mild anemia noted on her last CBC last month.  ROS: See HPI  Studies Reviewed: SABRA   EKG Interpretation Date/Time:  Friday February 16 2024 09:07:48 EST Ventricular Rate:  70 PR Interval:  314 QRS Duration:  96 QT Interval:  392 QTC  Calculation: 423 R Axis:   -18  Text Interpretation: Atrial flutter with 4:1 A-V conduction Septal infarct , age undetermined When compared with ECG of 02-Feb-2024 07:35, Atrial flutter Nonspecific T wave abnormality has replaced inverted T waves in Sinus rhythm Criteria for Septal infarct are now Present Confirmed by Olena Willy, Lonni [id number]) on 02/16/2024 9:14:13 AM    Coronary CTA (02/12/2024): Minimal stenosis of the LMCA, LAD, and RCA.  Coronary calcium  score 46 (36th percentile for age and sex matched controls).  Mild dilation of the aortic root and main pulmonary artery (3.9 and 3.0 cm, respectively).  TTE (12/28/2023): Normal LV size with mild LVH.  LVEF 60-65% with normal wall motion and indeterminate diastolic parameters.  Normal RV size and function.  Mild to moderate left atrial enlargement.  No pericardial effusion mitral annular calcification with mild regurgitation.  Mild aortic regurgitation.  Normal CVP.  Risk Assessment/Calculations:    CHA2DS2-VASc Score = 4   This indicates a 4.8% annual risk of stroke. The patient's score is based upon: CHF History: 0 HTN History: 1 Diabetes History: 0 Stroke History: 0 Vascular Disease History: 0 Age Score: 2 Gender Score: 1            Physical Exam:   VS:  BP (!) 124/58   Pulse 70   Ht 5' 2 (1.575 m)   Wt 174 lb 12.8 oz (79.3 kg)   SpO2 96%   BMI 31.97 kg/m    Wt Readings from Last 3 Encounters:  02/16/24 174 lb 12.8 oz (79.3 kg)  02/02/24 170 lb (77.1 kg)  01/02/24 171 lb 12.8 oz (77.9 kg)  General:  NAD. Neck: No JVD or HJR. Lungs: Clear to auscultation bilaterally without wheezes or crackles. Heart: Predominantly regular rate and rhythm, though intermittent irregularity is noted.  1/6 systolic murmur appreciated. Abdomen: Soft, nontender, nondistended. Extremities: No lower extremity edema.  ASSESSMENT AND PLAN: .    Atrial fibrillation/flutter: Holly Harrington was noted to be in atrial fibrillation for the first  time at our visit in 12/2023, when she also had RML pneumonia.  Due to persistent atrial fibrillation following treatment of her PNA, she underwent successful DCCV last month.  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  She still notes some fatigue and exertional dyspnea, though it is better than before her DCCV.  We discussed further medication titration, event monitoring to better understand the persistence of her atrial fibrillation/flutter and ventricular rate control, and referral to EP; Holly Harrington wishes to purse ambulatory monitoring at this time.  We will continue her current doses of metoprolol  and diltiazem , as well as long-term anticoagulation with apixaban .  In light of her mild anemia on last check prior to DCCV, we have agreed to repeat a CBC to ensure that this is not worsening.  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  Echo in 12/2022 did not show progression of her mitral and tricuspid valve disease (previously thought to be moderate to severe, though read as mild on most recent echo).  We will defer medication changes and additional testing today; consider repeat echo in 6-12 months.  Hypertension: BP well-controlled today.  No medication changes at this time.  Anemia: Mild anemia noted on last check on 01/19/2024.  Holly Harrington denies bleeding.  We will repeat a CBC today to ensure stability/improvement.    Dispo: Return to clinic in 3 months.  Signed, Lonni Hanson, MD

## 2024-02-17 ENCOUNTER — Ambulatory Visit: Payer: Self-pay | Admitting: Internal Medicine

## 2024-02-17 ENCOUNTER — Encounter: Payer: Self-pay | Admitting: Internal Medicine

## 2024-02-17 DIAGNOSIS — I38 Endocarditis, valve unspecified: Secondary | ICD-10-CM | POA: Insufficient documentation

## 2024-02-17 DIAGNOSIS — Z79899 Other long term (current) drug therapy: Secondary | ICD-10-CM | POA: Insufficient documentation

## 2024-02-17 DIAGNOSIS — D649 Anemia, unspecified: Secondary | ICD-10-CM | POA: Insufficient documentation

## 2024-02-17 LAB — CBC
Hematocrit: 40.8 % (ref 34.0–46.6)
Hemoglobin: 12.7 g/dL (ref 11.1–15.9)
MCH: 29.1 pg (ref 26.6–33.0)
MCHC: 31.1 g/dL — ABNORMAL LOW (ref 31.5–35.7)
MCV: 93 fL (ref 79–97)
Platelets: 226 x10E3/uL (ref 150–450)
RBC: 4.37 x10E6/uL (ref 3.77–5.28)
RDW: 14.4 % (ref 11.7–15.4)
WBC: 8.6 x10E3/uL (ref 3.4–10.8)

## 2024-03-04 ENCOUNTER — Other Ambulatory Visit: Payer: Self-pay | Admitting: Internal Medicine

## 2024-03-05 MED ORDER — METOPROLOL TARTRATE 50 MG PO TABS: 50.0000 mg | ORAL_TABLET | Freq: Two times a day (BID) | ORAL | 3 refills | Status: AC

## 2024-03-13 ENCOUNTER — Telehealth: Payer: Self-pay | Admitting: Internal Medicine

## 2024-03-13 NOTE — Telephone Encounter (Signed)
 I received the ZIO Fax and printed and gave to Dr. Mady Nurse DELENA Desiderio.

## 2024-03-13 NOTE — Telephone Encounter (Signed)
 Monitor results provided to the MD for review

## 2024-03-17 ENCOUNTER — Encounter: Payer: Self-pay | Admitting: Internal Medicine

## 2024-03-23 DIAGNOSIS — I4892 Unspecified atrial flutter: Secondary | ICD-10-CM | POA: Diagnosis not present

## 2024-03-25 ENCOUNTER — Other Ambulatory Visit: Payer: Self-pay

## 2024-03-25 DIAGNOSIS — I4891 Unspecified atrial fibrillation: Secondary | ICD-10-CM

## 2024-03-25 MED ORDER — APIXABAN 5 MG PO TABS: 5.0000 mg | ORAL_TABLET | Freq: Two times a day (BID) | ORAL | 1 refills | Status: AC

## 2024-03-25 NOTE — Telephone Encounter (Signed)
 Prescription refill request for Eliquis  received. Indication: a fib Last office visit: 02/16/24 Scr: 0.72 epic 01/19/24 Age: 78 Weight: 79kg

## 2024-03-27 ENCOUNTER — Ambulatory Visit

## 2024-03-27 ENCOUNTER — Ambulatory Visit: Admitting: Podiatry

## 2024-03-27 VITALS — Ht 62.0 in | Wt 174.8 lb

## 2024-03-27 DIAGNOSIS — M21612 Bunion of left foot: Secondary | ICD-10-CM

## 2024-03-27 DIAGNOSIS — M2011 Hallux valgus (acquired), right foot: Secondary | ICD-10-CM | POA: Diagnosis not present

## 2024-03-27 DIAGNOSIS — M19071 Primary osteoarthritis, right ankle and foot: Secondary | ICD-10-CM

## 2024-03-27 DIAGNOSIS — M21611 Bunion of right foot: Secondary | ICD-10-CM

## 2024-03-27 DIAGNOSIS — M21619 Bunion of unspecified foot: Secondary | ICD-10-CM

## 2024-03-27 NOTE — Patient Instructions (Signed)
 More silicone pads can be purchased from:  [email or web address]    VISIT SUMMARY: During your visit, we discussed your worsening toe pain, which is related to your right foot bunion, hammer toe deformity, and osteoarthritis. We reviewed your symptoms and the impact of different types of footwear on your discomfort.  YOUR PLAN: -RIGHT FOOT HALLUX VALGUS (BUNION): A bunion is a bony bump that forms on the joint at the base of your big toe. Your chronic, severe bunion is causing discomfort and pressure between your toes, especially with certain footwear. Surgical intervention is not recommended due to your age and heart condition. We recommend continuing to use comfortable, wide-toed or open-toed footwear to minimize pressure on the bunion. We also provided toe spacers and padding options to reduce pressure and friction between your toes. Additional toe spacer products are available online, and we provided you with the website information.  -LESSER TOE CONTRACTURES (HAMMER TOES): Hammer toes are deformities that cause your toes to bend or curl downward instead of pointing forward. Your mild, reducible hammer toe deformities are contributing to pressure and discomfort at the tips of your toes, especially with certain shoes. We recommend using toe pads to reduce pressure on the tips of your toes and prevent excessive flexion. Additionally, wearing open-toed shoes and avoiding tight or restrictive footwear will help manage your symptoms.  -OSTEOARTHRITIS OF THE TOES: Osteoarthritis is a condition that causes the cartilage in your joints to wear down over time. Your chronic osteoarthritis in the lesser toe joints is causing intermittent sharp pain and discomfort, which is worsened by certain footwear. We recommend continuing to use comfortable footwear and toe pads to minimize joint irritation. You may also use acetaminophen  for pain relief as needed, although you do not routinely use it for  your feet.  INSTRUCTIONS: Please continue using comfortable, wide-toed or open-toed footwear and the toe spacers and pads provided. Consider purchasing additional toe spacer products online if needed. Use acetaminophen  for pain relief as necessary. Follow up with us  if your symptoms worsen or if you have any concerns.                      Contains text generated by [employer].                                 Contains text generated by [employer].

## 2024-03-30 NOTE — Progress Notes (Signed)
 "  Subjective:  Patient ID: Holly Harrington, female    DOB: 1946/03/06,  MRN: [id number]  Chief Complaint  Patient presents with   Bunions    New pt-bunions x 1 year. painful toes 2-4 bilateral  for sometime now. She denies any trauma or injury    Discussed the use of AI scribe software for clinical note transcription with the patient, who gave verbal consent to proceed.  History of Present Illness Holly Harrington is an 78 year old female with right foot bunion, hammer toe deformity, and osteoarthritis who presents for evaluation of worsening toe pain.  She has had a longstanding right foot bunion and hammer toe deformity, with recent increase in discomfort localized to the middle toes bilaterally. The pain is described as sharp, sometimes radiating among the second, third, or fourth toes, and is not clearly isolated to a single toe. The bunion itself has not been significantly painful despite its severity.  Toe pain is exacerbated by certain footwear, including tennis shoes with a wide toe box and older fabric shoes, and by walking barefoot on hardwood floors. The discomfort can become severe enough to require shoe removal. Open-toed sandals provide partial relief. She uses toe spacers and pads to reduce pressure and prevent interdigit rubbing, which helps alleviate symptoms. She does not routinely use acetaminophen  for foot pain.  She has been told she has osteoarthritis involving the toes. She denies burning pain, numbness, or stinging sensations. She develops a callus between the toes in the area of discomfort. She consistently wears flats and has avoided pumps since retirement, finding flats more comfortable and beneficial for her foot issues.      Objective:    Physical Exam VASCULAR: Venous insufficiency with varicose veins. DP and PT pulse palpable. Foot is warm and well-perfused. Capillary fill time is brisk. DERMATOLOGIC: Normal skin turgor, texture, and temperature. No open  lesions, rashes, or ulcerations. NEUROLOGIC: Normal sensation to light touch and pressure. No paresthesias. ORTHOPEDIC: Right foot hallux valgus deformity. Mild hematopoietic contractures reducible. Bilaterally adequate pulses. Smooth, pain-free range of motion of all examined joints. No pain on range of motion of inner phalangeal and metatarsal phalangeal joints. No pain on palpation of inner basis, no neuroma. No pain on bunion or first MPBJ on right foot. No ecchymosis or bruising. No gross deformity. No pain to palpation.   No images are attached to the encounter.    Results Radiology Right foot radiographs (03/27/2024): Hallux valgus deformity and lesser digital contractures (Independently interpreted)   Assessment:   1. Hallux valgus with bunions, right   2. Arthritis of right foot      Plan:  Patient was evaluated and treated and all questions answered.  Assessment and Plan Assessment & Plan Right foot hallux valgus (bunion) Chronic, severe right foot hallux valgus deformity causing discomfort and pressure between toes, particularly with certain footwear. No significant pain or acute changes. Surgical intervention is not recommended due to advanced age, comorbid heart disease, and limited symptomatic benefit. Conservative management is appropriate. - Recommended continued use of comfortable, wide-toed or open-toed footwear to minimize pressure on the bunion. - Provided toe spacers and padding options to reduce pressure and friction between toes. - Discussed additional toe spacer products available online ([employer]) and provided website information.  Lesser toe contractures (hammer toes) Mild, reducible hammer toe deformities bilaterally, contributing to pressure and discomfort at the tips of the toes, especially with certain shoes. No fixed contractures or severe deformity. Conservative management is  appropriate. - Provided toe pads to reduce pressure on the tips of  the toes and prevent excessive flexion. - Recommended use of open-toed shoes and avoidance of tight or restrictive footwear.  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 No signs of acute inflammation. Conservative management is appropriate. - Recommended continued use of comfortable footwear and toe pads to minimize joint irritation. - Discussed the option of acetaminophen  for pain as needed, though she does not routinely use it for her feet.      No follow-ups on file.   "

## 2024-04-10 ENCOUNTER — Ambulatory Visit (HOSPITAL_BASED_OUTPATIENT_CLINIC_OR_DEPARTMENT_OTHER)
Admission: RE | Admit: 2024-04-10 | Discharge: 2024-04-10 | Disposition: A | Discharge status: Home / Self Care | Source: Ambulatory Visit | Attending: Obstetrics and Gynecology | Admitting: Obstetrics and Gynecology

## 2024-04-10 DIAGNOSIS — M810 Age-related osteoporosis without current pathological fracture: Secondary | ICD-10-CM | POA: Diagnosis present

## 2024-04-30 ENCOUNTER — Other Ambulatory Visit

## 2024-05-02 ENCOUNTER — Other Ambulatory Visit: Payer: Self-pay

## 2024-05-02 ENCOUNTER — Emergency Department
Admission: EM | Admit: 2024-05-02 | Discharge: 2024-05-02 | Disposition: A | Discharge status: Home / Self Care | Attending: Emergency Medicine | Admitting: Emergency Medicine

## 2024-05-02 ENCOUNTER — Emergency Department

## 2024-05-02 DIAGNOSIS — Z7901 Long term (current) use of anticoagulants: Secondary | ICD-10-CM | POA: Insufficient documentation

## 2024-05-02 DIAGNOSIS — I482 Chronic atrial fibrillation, unspecified: Secondary | ICD-10-CM | POA: Insufficient documentation

## 2024-05-02 DIAGNOSIS — M79662 Pain in left lower leg: Secondary | ICD-10-CM | POA: Diagnosis present

## 2024-05-02 DIAGNOSIS — T148XXA Other injury of unspecified body region, initial encounter: Secondary | ICD-10-CM

## 2024-05-02 DIAGNOSIS — M7981 Nontraumatic hematoma of soft tissue: Secondary | ICD-10-CM | POA: Diagnosis not present

## 2024-05-02 NOTE — ED Provider Notes (Signed)
" ° °  [facility] Provider Note    Event Date/Time   First MD Initiated Contact with Patient 05/02/24 [phone]     (approximate)   History   Leg Swelling   HPI  Holly Harrington is a 79 y.o. 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  She reported it started suddenly while she was standing.  This morning she noticed bruising in the area and continued pain.  No significant swelling or redness     Physical Exam   Triage Vital Signs: ED Triage Vitals  Encounter Vitals Group     BP 05/02/24 0919 127/88     Girls Systolic BP Percentile --      Girls Diastolic BP Percentile --      Boys Systolic BP Percentile --      Boys Diastolic BP Percentile --      Pulse Rate 05/02/24 0919 92     Resp 05/02/24 0919 18     Temp 05/02/24 0919 98 F (36.7 C)     Temp src --      SpO2 05/02/24 0919 94 %     Weight 05/02/24 0920 74.8 kg (165 lb)     Height 05/02/24 0920 1.549 m (5' 1)     Head Circumference --      Peak Flow --      Pain Score 05/02/24 0919 2     Pain Loc --      Pain Education --      Exclude from Growth Chart --     Most recent vital signs: Vitals:   05/02/24 0919  BP: 127/88  Pulse: 92  Resp: 18  Temp: 98 F (36.7 C)  SpO2: 94%     General: Awake, no distress.  CV:  Good peripheral perfusion.  Resp:  Normal effort.  Abd:  No distention.  Other:  Mild bruising to the left posterior calf more lateral, suspect hematoma, otherwise reassuring exam, normal pulses, no significant edema   ED Results / Procedures / Treatments   Labs (all labs ordered are listed, but only abnormal results are displayed) Labs Reviewed - No data to display   EKG     RADIOLOGY     PROCEDURES:  Critical Care performed:   Procedures   MEDICATIONS ORDERED IN ED: Medications - No data to display   IMPRESSION / MDM / ASSESSMENT AND PLAN / ED  COURSE  I reviewed the triage vital signs and the nursing notes. Patient's presentation is most consistent with acute illness / injury with system symptoms.  Differential includes hematoma/bruising, less likely DVT, not consistent with infection  Pending ultrasound  Ultrasound negative for DVT, consistent with small hematoma, supportive care only      FINAL CLINICAL IMPRESSION(S) / ED DIAGNOSES   Final diagnoses:  Hematoma     Rx / DC Orders   ED Discharge Orders     None        Note:  This document was prepared using Dragon voice recognition software and may include unintentional dictation errors.   Arlander Charleston, MD 05/02/24 1333  "

## 2024-05-02 NOTE — Discharge Instructions (Addendum)
 Your ultrasound was negative for DVT, you have a small hematoma as we expected .this will resolve on its own

## 2024-05-02 NOTE — ED Triage Notes (Signed)
 Pt to ED for left leg pain and swelling started last night. Sent from doctor for DVT rule out

## 2024-05-20 ENCOUNTER — Ambulatory Visit: Admitting: Medical

## 2024-05-21 ENCOUNTER — Ambulatory Visit: Admitting: Cardiology
